# Patient Record
Sex: Female | Born: 1970 | Race: Asian | Hispanic: No | Smoking: Current every day smoker
Health system: Southern US, Community
[De-identification: ages and names within clinical notes are randomized; demographics above are authoritative.]

## PROBLEM LIST (undated history)

## (undated) DIAGNOSIS — I639 Cerebral infarction, unspecified: Secondary | ICD-10-CM

## (undated) DIAGNOSIS — N200 Calculus of kidney: Secondary | ICD-10-CM

## (undated) DIAGNOSIS — E039 Hypothyroidism, unspecified: Secondary | ICD-10-CM

## (undated) HISTORY — PX: OVARIAN CYST REMOVAL: SHX89

---

## 2000-10-22 ENCOUNTER — Encounter: Payer: Self-pay | Admitting: Neurosurgery

## 2000-10-22 ENCOUNTER — Inpatient Hospital Stay (HOSPITAL_COMMUNITY): Admission: EM | Admit: 2000-10-22 | Discharge: 2000-10-28 | Payer: Self-pay | Admitting: Neurosurgery

## 2000-10-25 ENCOUNTER — Encounter: Payer: Self-pay | Admitting: Neurosurgery

## 2000-11-21 ENCOUNTER — Encounter: Payer: Self-pay | Admitting: Neurosurgery

## 2000-11-21 ENCOUNTER — Encounter: Admission: RE | Admit: 2000-11-21 | Discharge: 2000-11-21 | Payer: Self-pay | Admitting: Neurosurgery

## 2001-01-01 ENCOUNTER — Encounter: Payer: Self-pay | Admitting: Hematology & Oncology

## 2001-01-01 ENCOUNTER — Ambulatory Visit (HOSPITAL_COMMUNITY): Admission: RE | Admit: 2001-01-01 | Discharge: 2001-01-01 | Payer: Self-pay | Admitting: Hematology & Oncology

## 2001-01-02 ENCOUNTER — Ambulatory Visit (HOSPITAL_COMMUNITY): Admission: RE | Admit: 2001-01-02 | Discharge: 2001-01-02 | Payer: Self-pay | Admitting: Hematology & Oncology

## 2001-01-02 ENCOUNTER — Encounter: Payer: Self-pay | Admitting: Hematology & Oncology

## 2001-06-20 ENCOUNTER — Encounter: Payer: Self-pay | Admitting: Neurosurgery

## 2001-06-20 ENCOUNTER — Ambulatory Visit (HOSPITAL_COMMUNITY): Admission: RE | Admit: 2001-06-20 | Discharge: 2001-06-20 | Payer: Self-pay | Admitting: Neurosurgery

## 2001-06-21 ENCOUNTER — Encounter: Payer: Self-pay | Admitting: Neurosurgery

## 2001-07-24 ENCOUNTER — Encounter: Payer: Self-pay | Admitting: Neurosurgery

## 2001-07-24 ENCOUNTER — Encounter: Admission: RE | Admit: 2001-07-24 | Discharge: 2001-07-24 | Payer: Self-pay | Admitting: Neurosurgery

## 2001-08-07 ENCOUNTER — Encounter: Payer: Self-pay | Admitting: Neurosurgery

## 2001-08-07 ENCOUNTER — Encounter: Admission: RE | Admit: 2001-08-07 | Discharge: 2001-08-07 | Payer: Self-pay | Admitting: Neurosurgery

## 2001-08-23 ENCOUNTER — Encounter: Admission: RE | Admit: 2001-08-23 | Discharge: 2001-08-23 | Payer: Self-pay | Admitting: Neurosurgery

## 2001-08-23 ENCOUNTER — Encounter: Payer: Self-pay | Admitting: Neurosurgery

## 2004-06-29 ENCOUNTER — Ambulatory Visit: Payer: Self-pay | Admitting: Unknown Physician Specialty

## 2004-07-29 ENCOUNTER — Inpatient Hospital Stay: Payer: Self-pay | Admitting: Unknown Physician Specialty

## 2004-09-17 ENCOUNTER — Ambulatory Visit: Payer: Self-pay | Admitting: Urology

## 2004-10-06 ENCOUNTER — Ambulatory Visit: Payer: Self-pay | Admitting: Urology

## 2004-10-21 ENCOUNTER — Emergency Department: Payer: Self-pay | Admitting: Emergency Medicine

## 2004-10-22 ENCOUNTER — Ambulatory Visit: Payer: Self-pay | Admitting: Urology

## 2006-09-12 HISTORY — PX: ABDOMINAL HYSTERECTOMY: SHX81

## 2006-10-15 ENCOUNTER — Inpatient Hospital Stay: Payer: Self-pay | Admitting: Internal Medicine

## 2006-10-15 ENCOUNTER — Other Ambulatory Visit: Payer: Self-pay

## 2006-10-18 ENCOUNTER — Ambulatory Visit: Payer: Self-pay | Admitting: Internal Medicine

## 2006-12-06 ENCOUNTER — Ambulatory Visit: Payer: Self-pay | Admitting: Unknown Physician Specialty

## 2006-12-14 ENCOUNTER — Inpatient Hospital Stay: Payer: Self-pay | Admitting: Unknown Physician Specialty

## 2006-12-18 ENCOUNTER — Other Ambulatory Visit: Payer: Self-pay

## 2006-12-18 ENCOUNTER — Inpatient Hospital Stay: Payer: Self-pay | Admitting: *Deleted

## 2007-03-27 ENCOUNTER — Ambulatory Visit: Payer: Self-pay | Admitting: Psychiatry

## 2007-03-27 ENCOUNTER — Inpatient Hospital Stay (HOSPITAL_COMMUNITY): Admission: AD | Admit: 2007-03-27 | Discharge: 2007-03-29 | Payer: Self-pay | Admitting: Psychiatry

## 2008-01-31 IMAGING — US US RENAL KIDNEY
1 series · 17 of 25 positions shown · non-contrast
Comparison: none

REASON FOR EXAM: hematuria, renal insufficiency, back pain
COMMENTS:

[Series 1: us renal kidney · 17 of 29 slices shown]
[im 1/29]
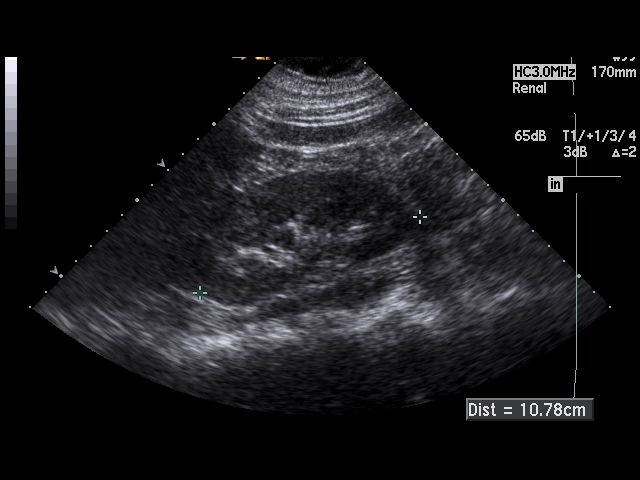
[im 3/29]
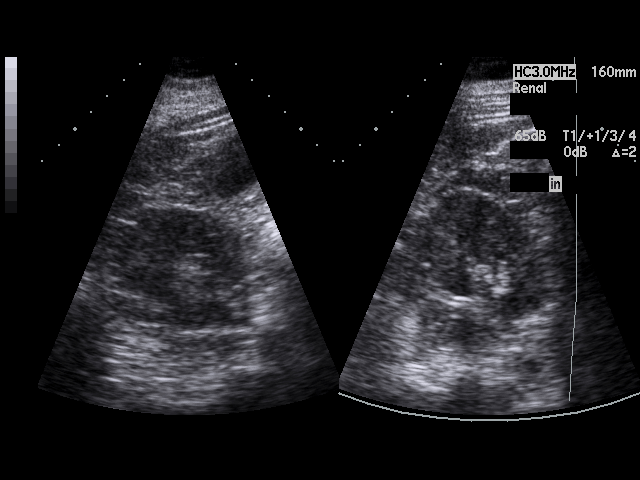
[im 4/29]
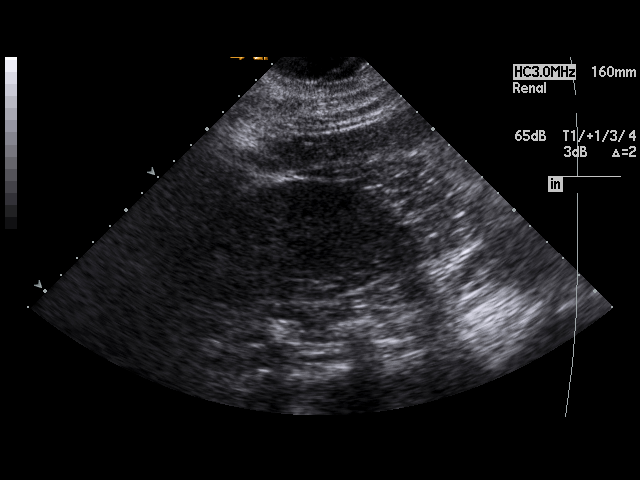
[im 6/29]
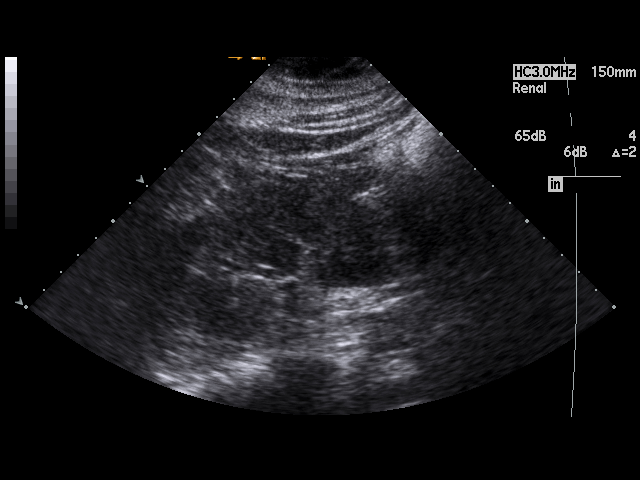
[im 8/29]
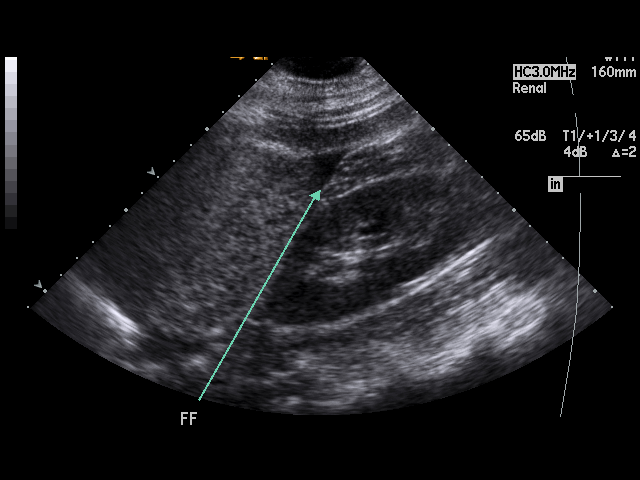
[im 10/29]
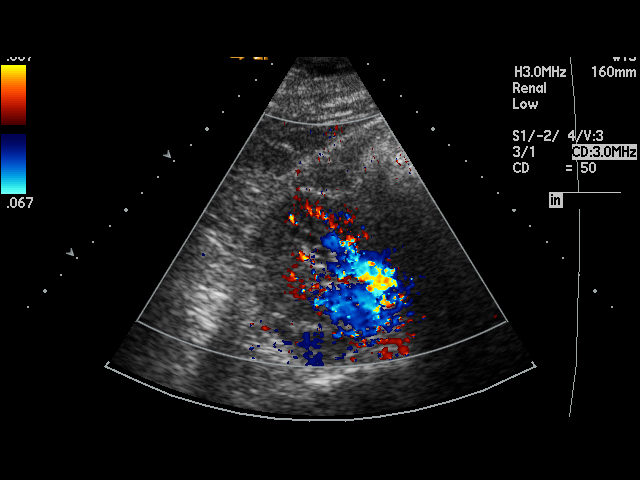
[im 11/29]
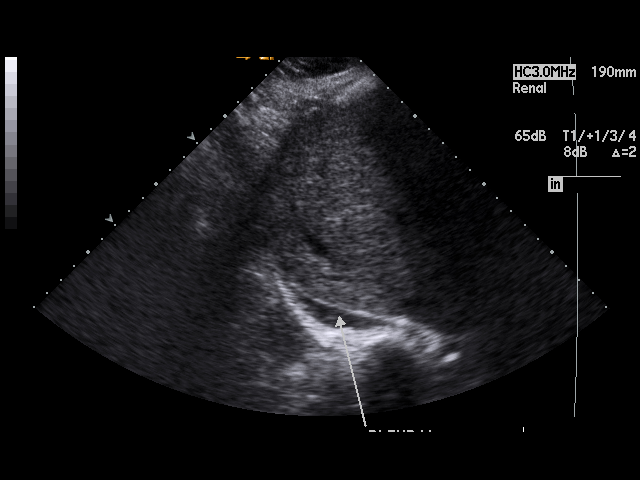
[im 13/29]
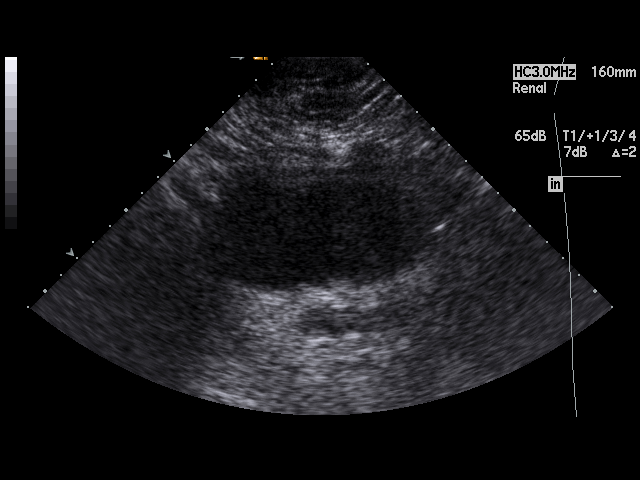
[im 15/29]
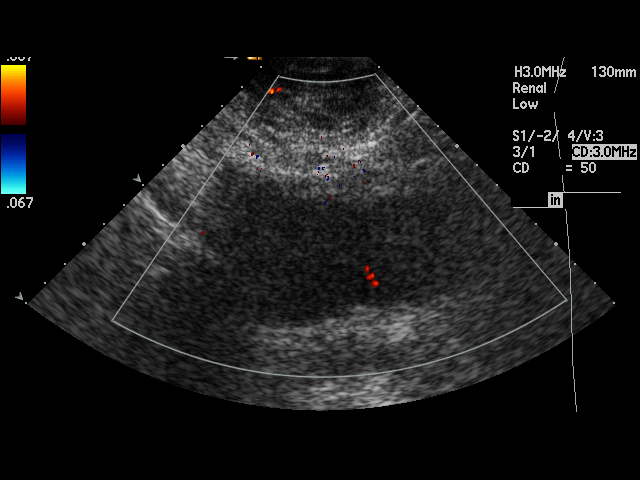
[im 16/29]
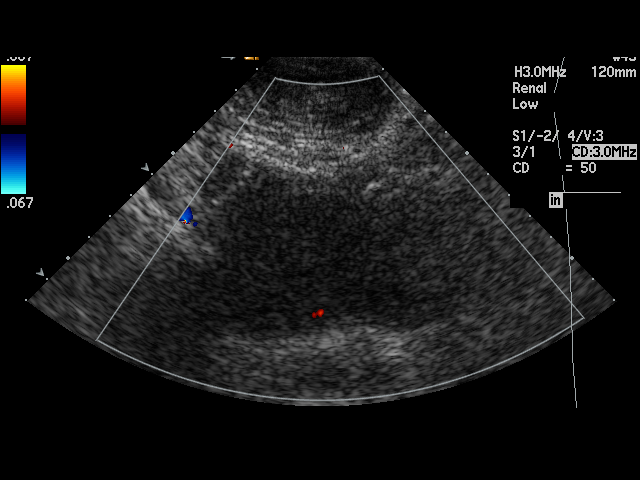
[im 18/29]
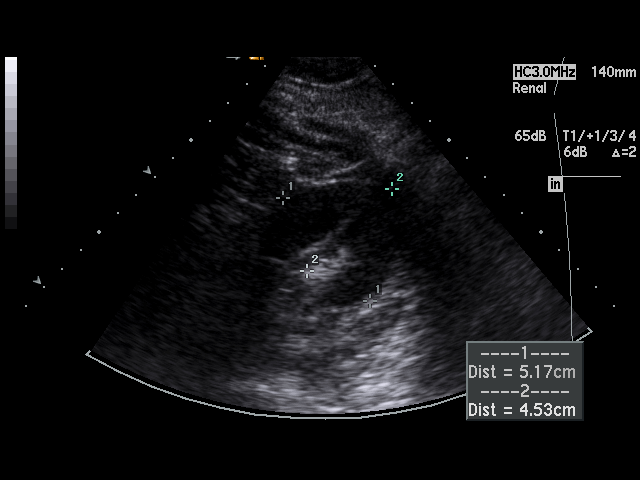
[im 19/29]
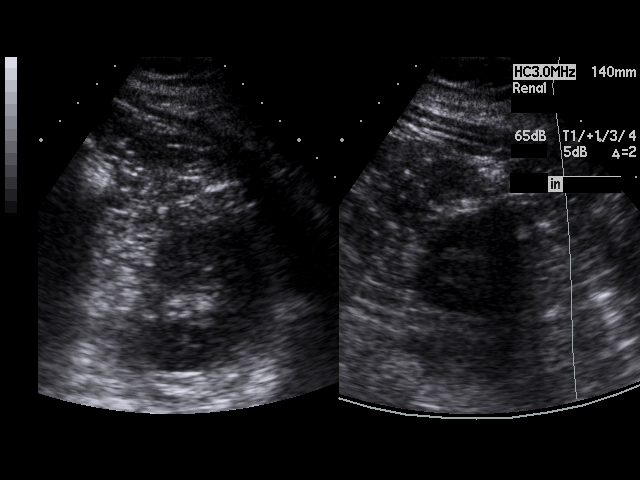
[im 22/29]
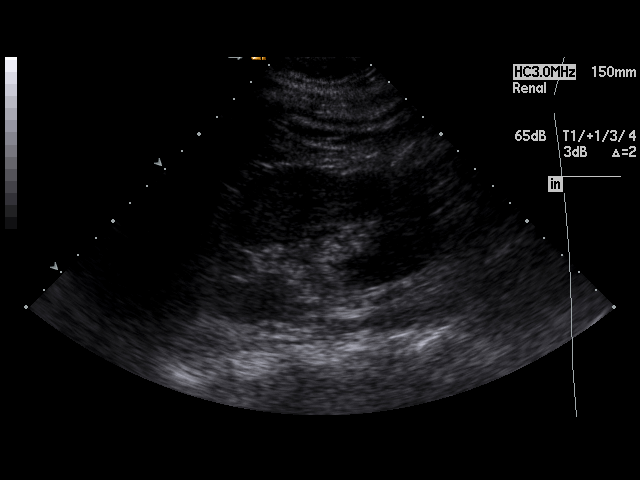
[im 23/29]
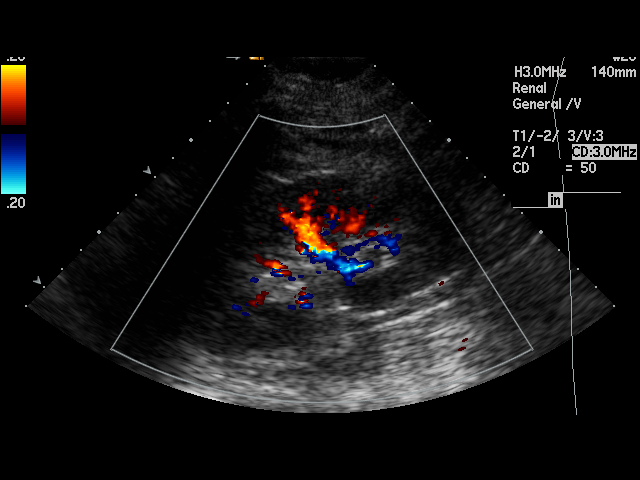
[im 25/29]
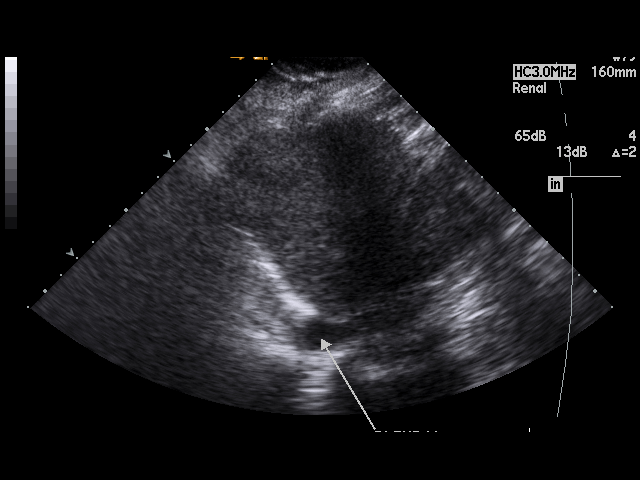
[im 26/29]
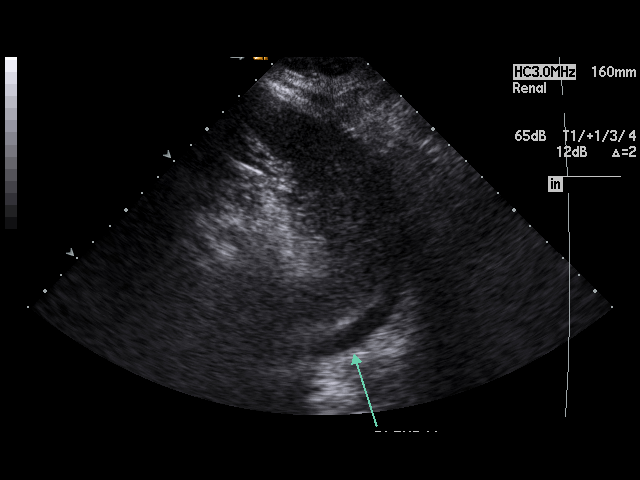
[im 29/29]
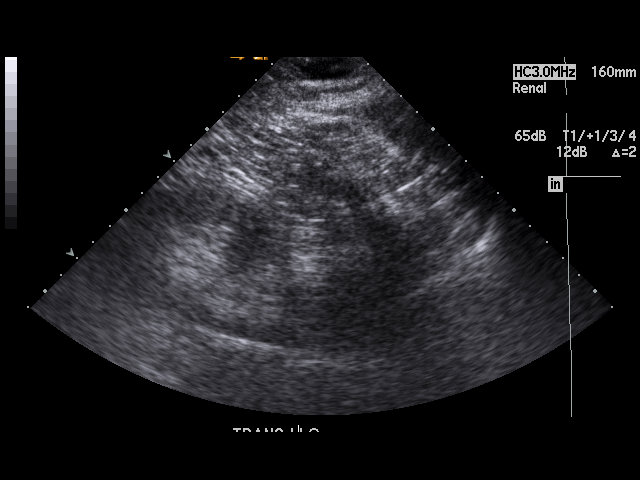

[17 of 25 positions shown; findings below may reference images not displayed]

PROCEDURE:     US  - US KIDNEY BILATERAL  - December 20, 2006  [DATE]

RESULT:     The RIGHT kidney measures 10.78 cm x 4.78 cm x 4.94 cm and the
LEFT kidney measured 11.69 cm x 5.17 cm x 4.53 cm. No solid or cystic renal
mass lesions are seen. The renal cortical margins are smooth. The renal
cortex is normal in thickness. The visualized portion of the urinary bladder
shows no significant abnormalities.

There is a trace of fluid anterior to the RIGHT kidney. Also noted is a
RIGHT pleural effusion.
IMPRESSION: 1. No hydronephrosis or renal mass lesions are identified.
2. No renal calcifications are seen.
3. The kidneys are within normal limits for size.
4. There is trace of fluid anterior to the RIGHT kidney.
5. There is a small RIGHT pleural effusion.

## 2008-02-01 IMAGING — CT CT CHEST-ABD W/ CM
1 of 2 series · 13 of 32 positions shown, 18 images · non-contrast
Comparison: none

REASON FOR EXAM: (1) followup pulmonary infiltrates; (2) follow-up
abnormal renal CT
COMMENTS:

[Series 2: soft tissue · axial · 0.79mm/px · z∈[-40,+346]mm · 13 of 89 slices shown, 18 images]
[im 6/89  mediastinal]
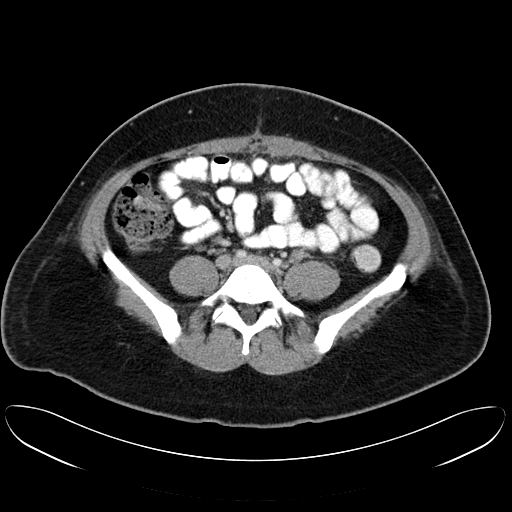
[im 6/89  bone]
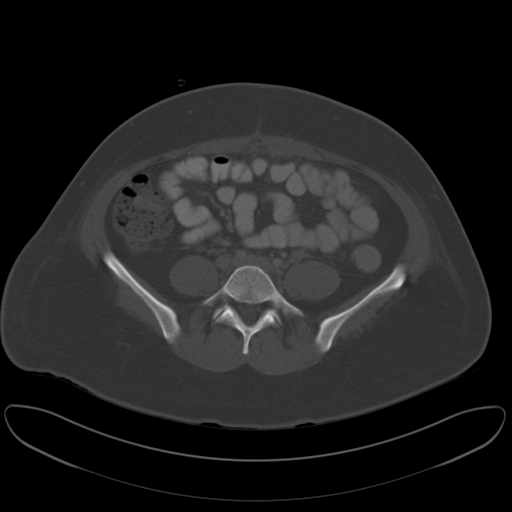
[im 17/89  mediastinal]
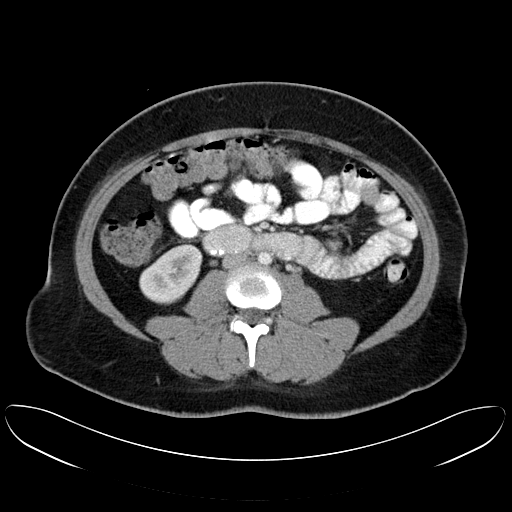
[im 23/89  mediastinal]
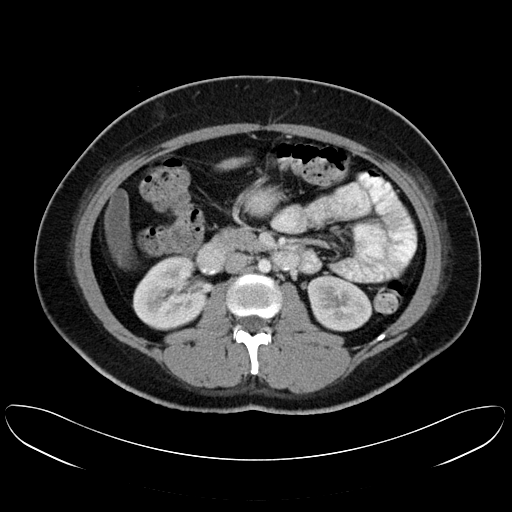
[im 30/89  mediastinal]
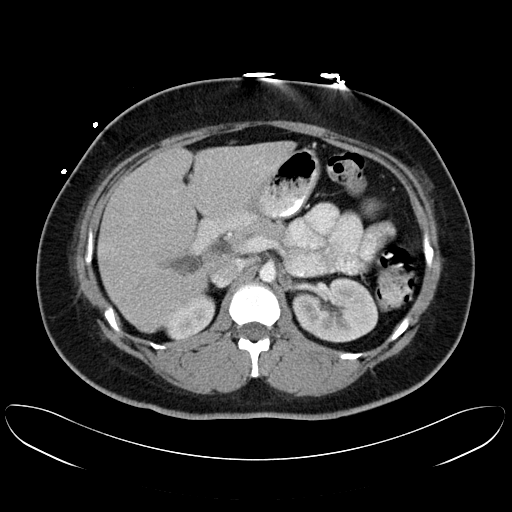
[im 34/89  mediastinal]
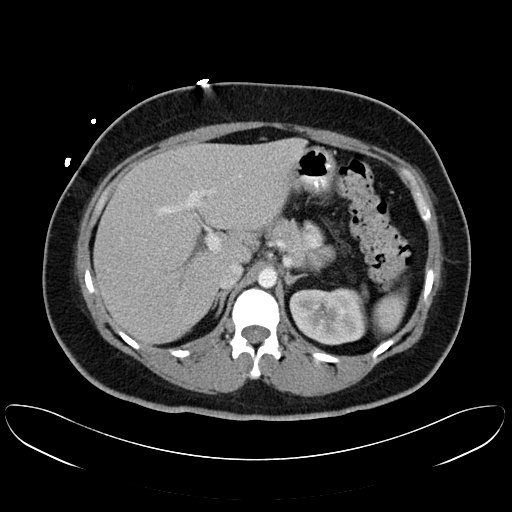
[im 43/89  mediastinal]
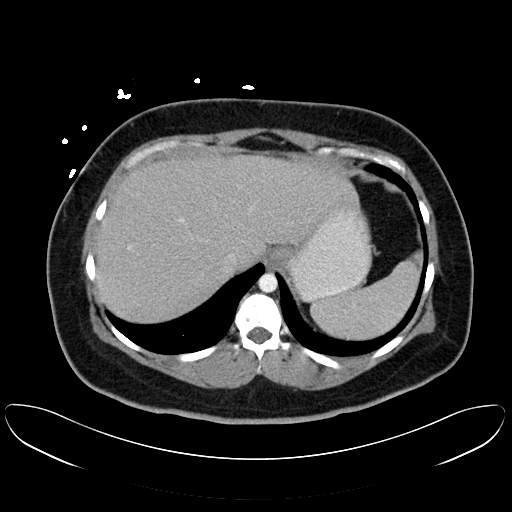
[im 45/89  mediastinal]
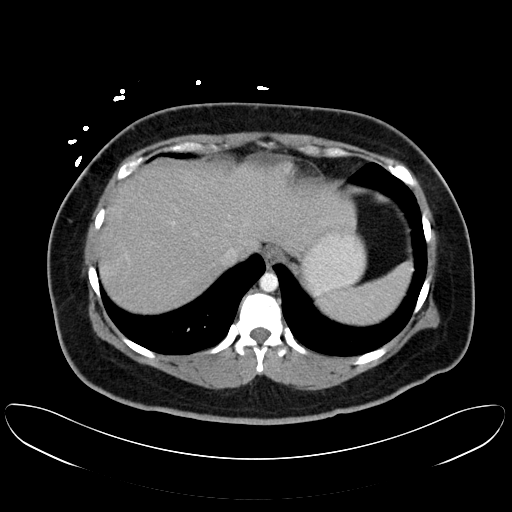
[im 56/89  mediastinal]
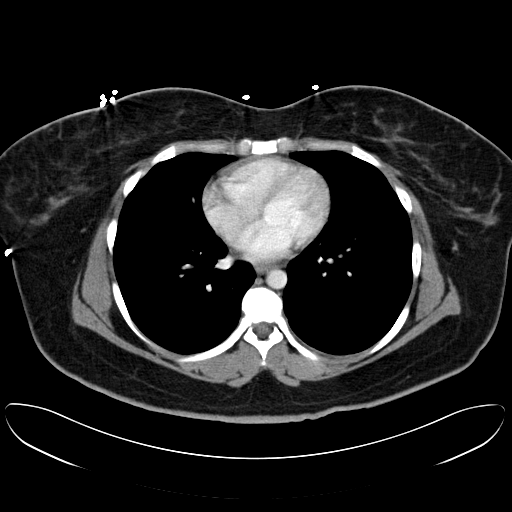
[im 59/89  mediastinal]
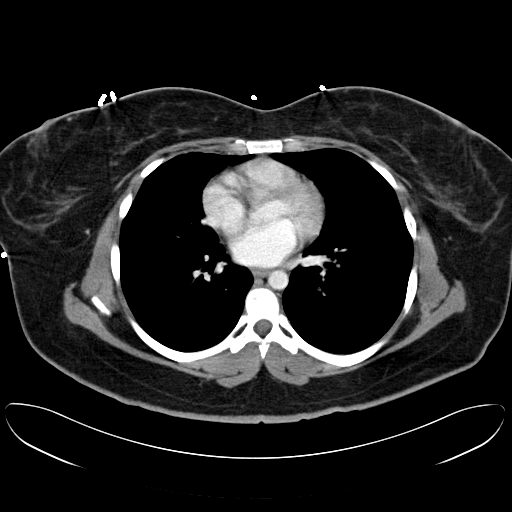
[im 59/89  bone]
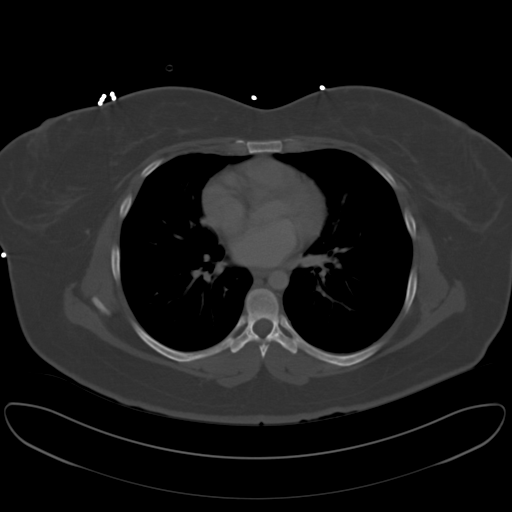
[im 67/89  mediastinal]
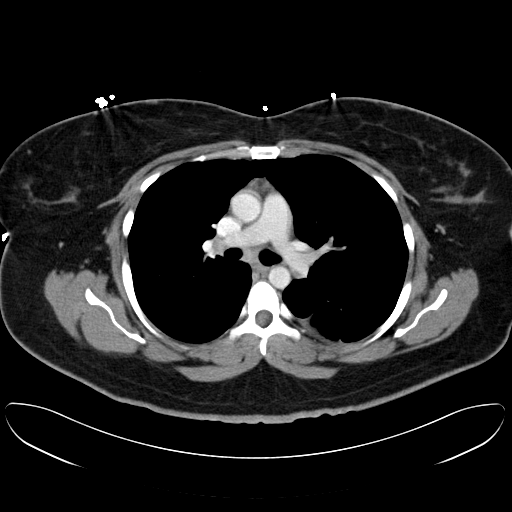
[im 67/89  lung]
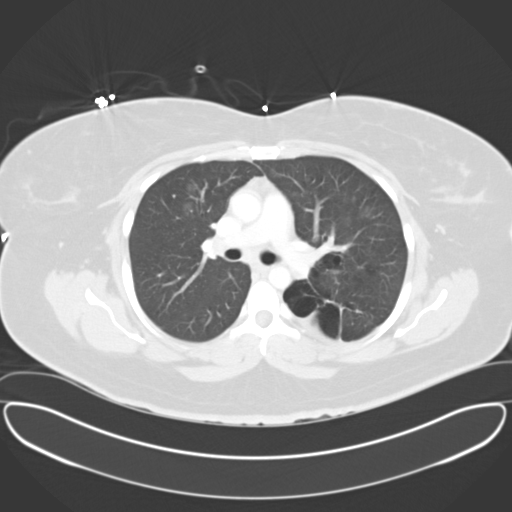
[im 72/89  mediastinal]
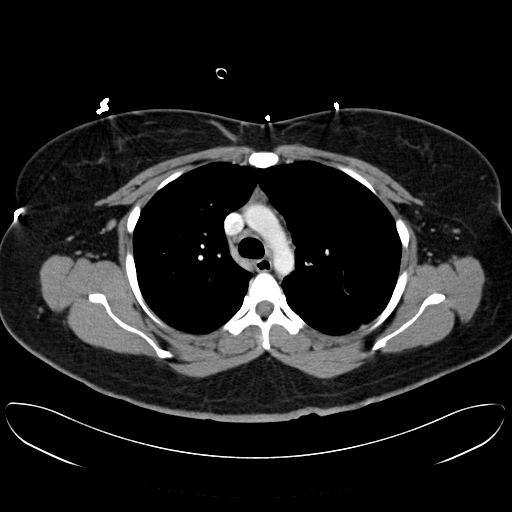
[im 72/89  lung]
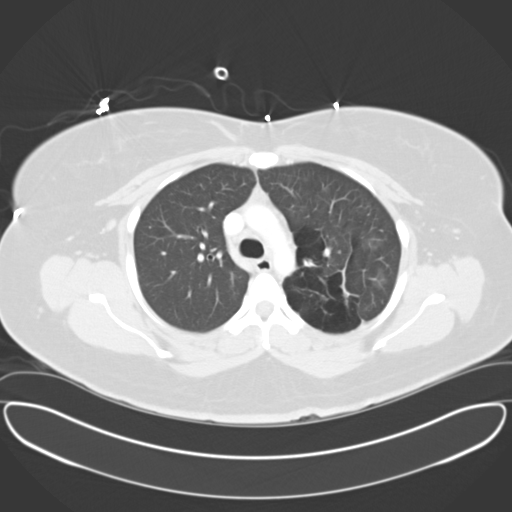
[im 78/89  lung]
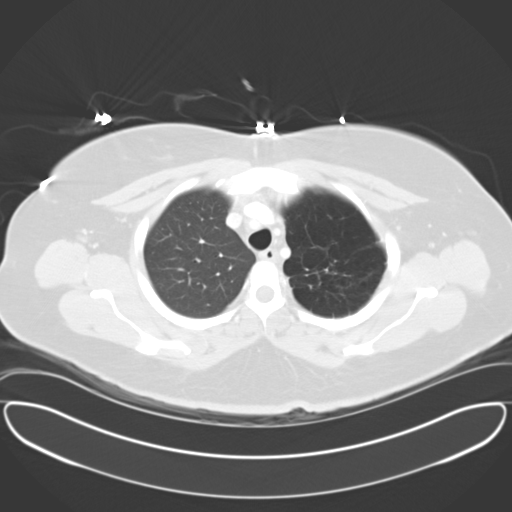
[im 83/89  mediastinal]
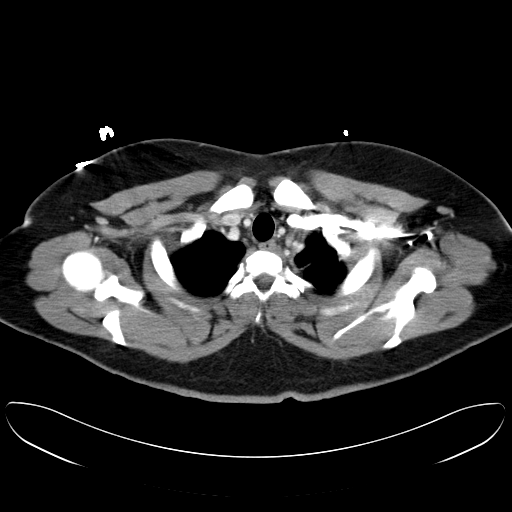
[im 83/89  lung]
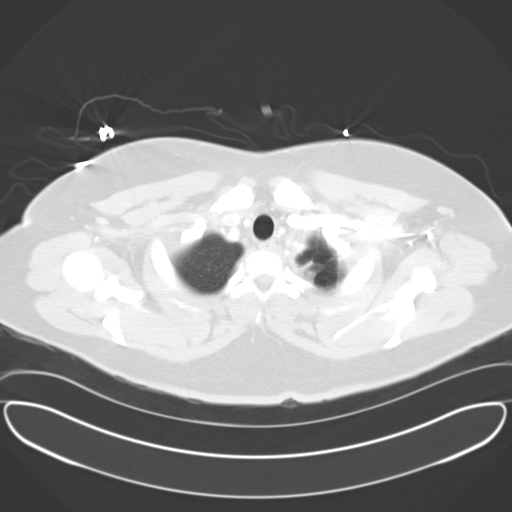

[13 of 32 positions shown; findings below may reference images not displayed]

PROCEDURE:     CT  - CT CHEST AND ABDOMEN W  - December 21, 2006 [DATE]

RESULT:     Comparison is made to a CT scan of the chest dated 18 December, 2006.

CT SCAN OF THE CHEST: The cardiac chambers are normal in size. There is no
pleural or pericardial effusion. The asymmetry of the density of the LEFT
upper lobe with respect to the remainder of both lungs persists. This likely
reflects lobar emphysema. The increased interstitial density seen elsewhere
in the lungs on the prior study has markedly improved such that the
appearance of both lungs is normal outside of the emphysematous LEFT upper
lobe. I see no pulmonary parenchymal masses. Within the mediastinum the
fullness in the subcarinal region has become less conspicuous and is now
normal. The LEFT pleural effusion is noted to have cleared. The caliber of
the thoracic aorta is normal.
CONCLUSION: 1)There has been marked improvement in the appearance of both lungs with
resolution of the interstitial edema. The LEFT upper lobe remains
emphysematous.
2. There has been resolution of the pleural effusions bilaterally.
3. Fullness in the subcarinal region is no longer evident.

CT SCAN OF THE ABDOMEN: The patient received the aforementioned IV contrast
as well as oral contrast material.

The bowel gas pattern is within the limits of normal. There is no evidence
of ileus or obstruction. The liver, gallbladder, spleen, stomach, pancreas,
adrenal glands, and kidneys exhibit no acute abnormality. The caliber of the
abdominal aorta is normal. There is no periaortic or pericaval
lymphadenopathy. There is no evidence of ascites. Specific attention to both
kidneys reveals no evidence of obstruction, parenchymal masses, surrounding
inflammatory change, or calcified lymph nodes. The asymmetric enhancement
seen on the prior study is not evident today.
IMPRESSION: 1. There has been marked improvement in the appearance of the thorax. Please
see the conclusions above.
2. The kidneys are symmetric in enhancement pattern with no evidence of
obstruction or masses or calcification.
3. I see no acute abnormality elsewhere within the abdomen.

## 2008-02-03 IMAGING — CR DG CHEST 1V PORT
1 series · 1 of 1 positions shown · non-contrast
Comparison: none

REASON FOR EXAM: SOB
COMMENTS:   LMP: Post Hysterectomy

[view not recorded]
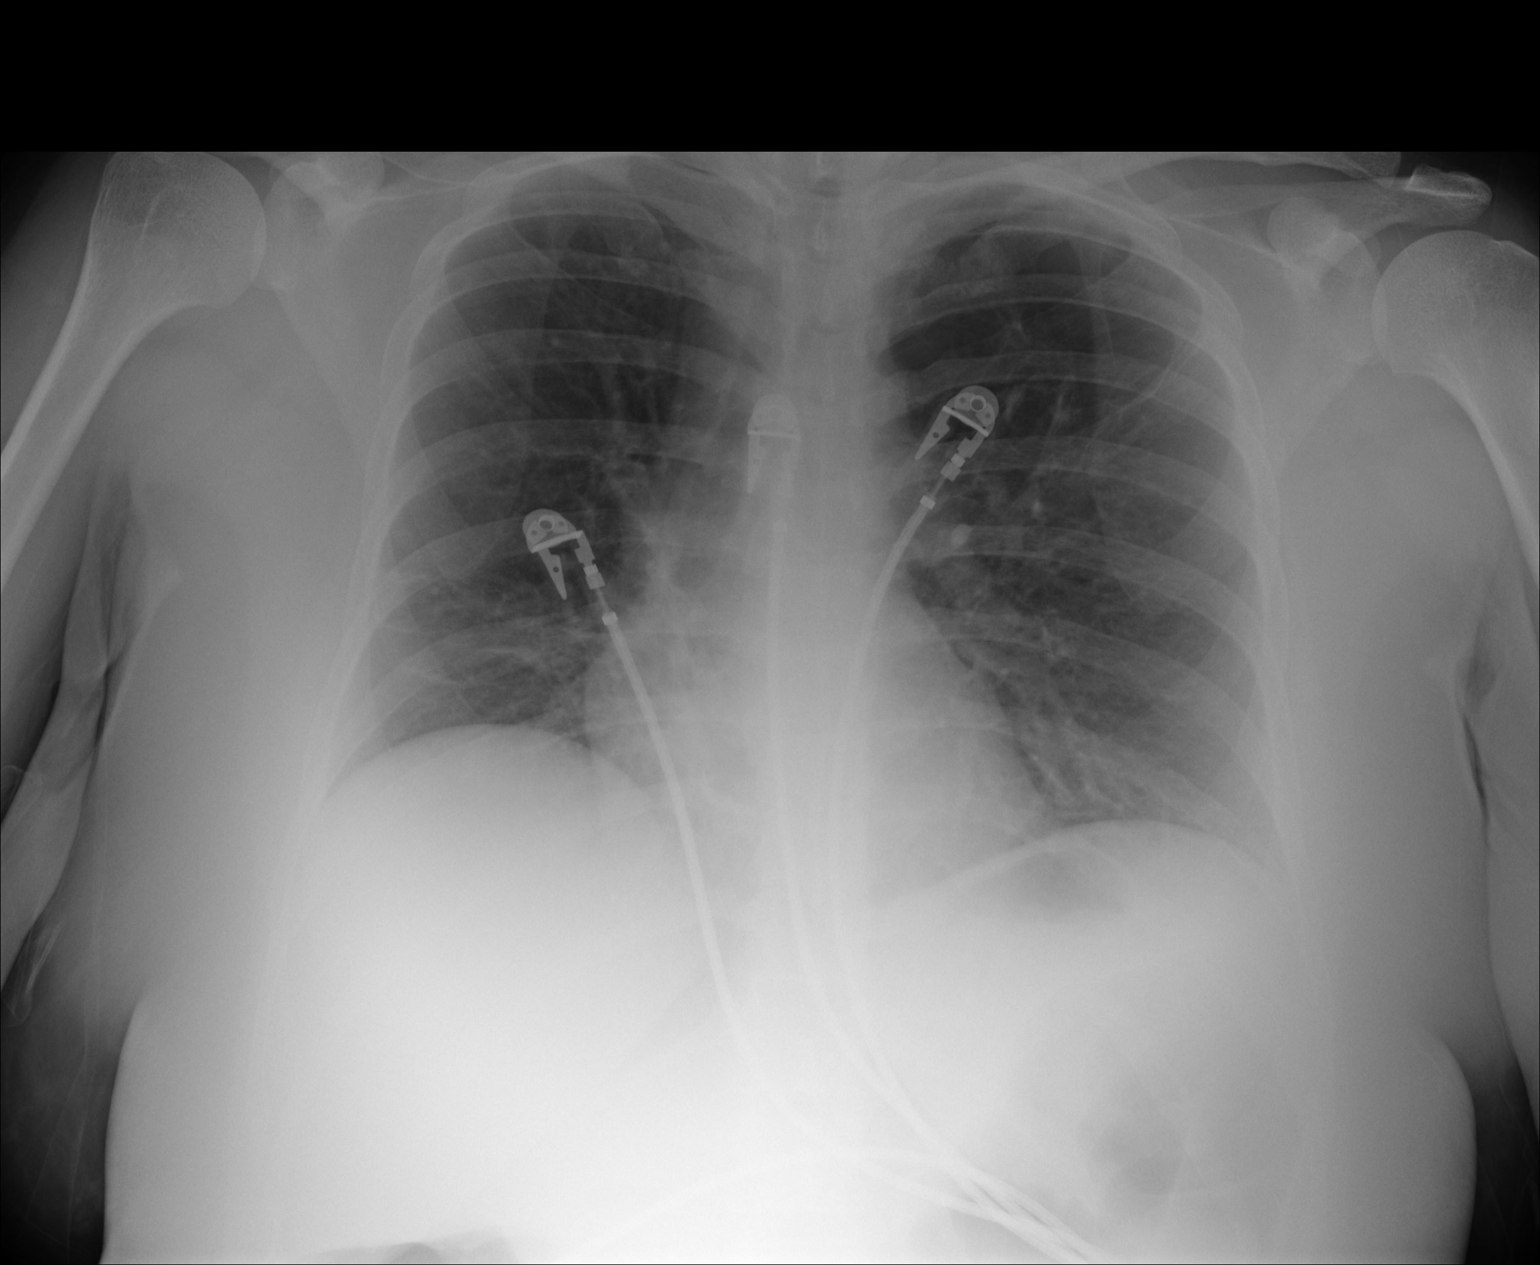

[1 of 1 positions shown; findings below may reference images not displayed]

PROCEDURE:     DXR - DXR PORTABLE CHEST SINGLE VIEW  - December 23, 2006  [DATE]

RESULT:     Comparison is made to study 18 December, 2006.

The lungs are slightly less well-inflated today. The interstitial markings
do not appear to have become more conspicuous. In fact, there has been
slight improvement in the appearance of the pulmonary interstitium since [DATE]. The pulmonary vascularity is mildly prominent though stable.
IMPRESSION:

## 2009-01-09 ENCOUNTER — Ambulatory Visit: Payer: Self-pay | Admitting: Specialist

## 2011-01-28 NOTE — H&P (Signed)
[place]. [facility]  Patient:    Evelyn Cunningham, Evelyn Cunningham                      MRN: [id number] Adm. Date:  [id number] Attending:  Emeterio Reeve                         History and Physical  ADMITTING DIAGNOSIS: Brain tumor.  HISTORY OF PRESENT ILLNESS: This patient is a 40 year old right-handed white female of [place] origin.  I was called from [facility] regarding her. She has had about a week long left occipital headache that is fairly severe, although this has been going on at a low level for about a month.  This is associated with some nausea and vomiting, pain in the left posterior pharynx, and inside her ear.  She has no hearing deficit.  PAST MEDICAL HISTORY: Benign.  PAST SURGICAL HISTORY: Colonoscopy.  ALLERGIES: No known drug allergies.  CURRENT MEDICATIONS:  1. Oral contraceptives.  2. Imitrex for two days, which she said helped once.  3. Vicodin p.r.n.  SOCIAL HISTORY: She does not smoke and does not drink.  REVIEW OF SYSTEMS: Negative.  PHYSICAL EXAMINATION:  GENERAL: She is awake and alert.  HEENT: Examination within normal limits.  NECK: She has good range of motion of her neck without lymphadenopathy noted.  CHEST: Clear.  CARDIAC: Regular rate and rhythm.  ABDOMEN: Nontender.  No hepatosplenomegaly.  EXTREMITIES: Without clubbing, cyanosis, or edema.  Peripheral pulses good.  GU: Examination deferred.  NEUROLOGIC: She is awake and alert and oriented.  PERRL.  EOMI.  Facial movement and sensation intact.  Tongue midline.  Speech normal.  She describes no swallowing difficulties.  Motor examination shows intact strength with no pronator drift.  She describes no sensory deficit.  Hoffmanns negative.  No clonus.  Toes downgoing bilaterally.  LABORATORY DATA: CT scan demonstrates an approximate 1-2 cm lesion in the left anterior occiput lying along the tentorium on the left side.  There is no contrast given but there  is minimal, if any, shift and no edema associated with it.  CLINICAL IMPRESSION: Brain lesion.  Differential would include hemangioma, brain tumor, and abscess.  Lacking edema or shift abscess and tumor seem unlikely.  I favor hemangioma with perhaps a small amount of bleeding associated with it.  Obvious other concern might be an AV fistula, although this would be a bit far-fetched with these findings.  PLAN: I think the most reasonable to do is admit her for pain control and obtain an MRI. DD:  10/23/99 TD:  10/22/00 Job: [id number]

## 2011-01-28 NOTE — Consult Note (Signed)
[place]. [facility]  Patient:    Evelyn, Cunningham                      MRN: [id number] Proc. Date: 10/26/00 Adm. Date:  [id number] Attending:  Emeterio Reeve Dictator:   Rose Phi. Myna Hidalgo, M.D. CC:         Evelyn Cunningham, M.D.  Evelyn Cunningham, M.D.  Evelyn Cunningham. Evelyn Cunningham, M.D.   Consultation Report  REASON FOR CONSULTATION: 1. Superior sagittal side thrombosis. 2. Hypercoagulable state.  HISTORY OF PRESENT ILLNESS:  Ms. Evelyn Cunningham is a 40 year old [place] female, with no significant past medical history outside of having a miscarriage a couple of years ago.  She notes that she had a headache for about a week prior to being admitted to [facility].  She stated the headache was in the left occipital region.  This was fairly severe.  She noted pain radiating to the left posterior pharynx and left ear.  She had no other symptoms.  This was not particularly typical of a migraine, which she occasionally has.  She underwent a CT scan which showed a 1.5-2 cm lesion in the left anterior occiput, lying along the tentorium on the left side.  Unfortunately, contrast was not given.  She subsequently underwent an MRI.  The brain showed left transverse and sigmoid side thrombosis, along with clot in the left internal jugular vein.  There is a small brain hemorrhage along the undersurface of the left temporal lobe.  The left occipital lobe is slightly deformed, maybe from an old infarct.  There is no evidence of any other abscess or tumor.  She was admitted.  She went to the intensive care unit.  She was seen in consultation by Dr. Zola Button T. Cunningham.  She had ID consultation for the possibility of suppurative thrombophlebitis.  All cultures were negative. Again, she was seen by ID.  She was placed on some antibiotics empirically. She had an elevated white cell count with left shift.  She improved over the next several days.  She had an  excessive array of blood studies done for an underlying thrombophilia.  She had factor V widened and a prothrombinogen mutation sent off.  She had a lupus anticoagulant, and a phospholipid antibody, and ______ antibody sent off.  She had antithrombin III, protein S and protein C.  She also had factor VIII level sent off.  A homocysteine level included.  Unfortunately, these studies are not returned as yet.  PAST MEDICAL HISTORY:  Relatively unremarkable, aside of the miscarriage a couple of years ago.  ALLERGIES:  She has, I think, no allergy.  CURRENT MEDICATIONS: 1. Celebrex 100 mg q.12h. 2. Nizoral cream q.d. 3. Vicodin as needed.  SOCIAL HISTORY:  There is no history of tobacco use.  There is no alcohol use.  She works as a Occupational psychologist for [employer].  FAMILY HISTORY:  Only contributory for diabetes with her mother.  She has a sister having four miscarriages finally having a full-term pregnancy.  REVIEW OF SYSTEMS:  As I stated in the history of present illness.  PHYSICAL EXAMINATION:  GENERAL:  This is a slightly obese [place] female, in no obvious distress. She is alert and oriented x 3.  VITAL SIGNS:  Temperature 98.4, pulse 86, respiratory rate 20, blood pressure 128/70.  HEENT:  Normocephalic, atraumatic skull.  She has no ocular or oral lesions. She shows good extraocular muscle movements.  Pupils react appropriately.  She has no oral petechia.  There is no oral permutation.  NECK:  Good range of motion.  There is no meningismus.  There is a palpable cervical supraclavicular adenopathy.  Thyroid is not palpable.  LUNGS:  Clear to percussion and auscultation bilaterally.  CARDIAC:  Shows a regular rate and rhythm, with normal S1, S2.  There are murmurs, rubs or bruits.  ABDOMEN:  Slightly obese and soft.  She has good bowel sounds.  There is no palpable abdominal mass.  There is no palpable hepatosplenomegaly.  BACK:  Shows no tenderness over  the spine, ribs or hips.  EXTREMITIES:  Show no clubbing, cyanosis, or edema.  She had a good range of motion of her joints.  SKIN:  Does reveal rash consistent with tinea.  There was no maculopapular type rashes.  No malar rashes noted.  NEUROLOGIC:  Shows no focal neurological deficits.  LABORATORY STUDIES:  White cell count 10.4, hemoglobin 12.6, hematocrit 37.6, platelet count 315.  MCV 82.  White cell differential is within normal limits. PT 14, PTT 27.  Antithrombin III level 95.  She had albumin of only 2.5. Total protein 7.1.  LFTs are within normal limits.  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  This extends out to left internal jugular vein.  There clearly is an underlying hypercoagulable state with Evelyn Cunningham.  The fact that she is on oral contraceptives increases the risks even more of having a thrombotic episode.  The fact that she had it in the cerebral venous system is quite unusual, but definitely reported in the literature.  I suspect that we will likely find either a factor V widener and distal mutation; or anti phospholipid antibody or ______ syndrome.  The fact that she has had a miscarriage, alongside with that of a sister that has had four miscarriages, certainly suggests an ______ antibody somewhere along the way. Also on account on the fact that her parents are related, also would predispose to some type of hereditary thrombophilia.  The studies that have been sent off clearly will take several days before they return.  The role of anticoagulation with these cerebral vein thromboses is very controversial.  You can find arguments pro and con regarding anti correlation. Given the fact that she is stable, has no neurological deficits, and as far as  we know she has removed the stimulus for hypercoagulability (i.e. oral contraceptives), I think that we could  just observe her for right now.  If we do find that she does indeed have one or more hypercoagulable conditions, then we may need to anticoagulate her.  She clearly is to avoid oral contraceptives or estrogen replacement therapy. If she were to get pregnant, she would have to be placed on prophylactic anticoagulation with Lovenox or one of the other low molecular weight heparins.  I believe that it is okay for Ms. Colarusso to go home in the morning, as she has been stable.  She still has a little bit of a headache, but this is controlled with the Celebrex and Vicodin.  I will follow up with Ms. Kochanowski as an outpatient once I receive all of her results of her hypercoagulable workup.  I greatly appreciate the opportunity to see Ms. Scaletta.  She is a very nice lady who presents with a most interesting problem. DD:  10/26/00 TD:  10/26/00 Job: [id number]

## 2011-01-28 NOTE — Consult Note (Signed)
[place]. [facility]  Patient:    Evelyn Cunningham, Evelyn Cunningham                      MRN: [id number] Proc. Date: 10/22/00 Adm. Date:  [id number] Attending:  Emeterio Reeve CC:         Payton Doughty, M.D.   Consultation Report  CHIEF COMPLAINT:  Left headache and referred otalgia.  HISTORY OF PRESENT ILLNESS:  A 40 year old second generation [place] immigrant female has a one-week history of pain on the left side of her head, especially temporal and postauricular.  It has been getting worse.  Last evening she presented to [facility] Room where a CT scan showed a slight abnormality of the brain on that side, and she was transferred to [facility].  Here, an MRI scan suggested fluid in the mastoid as well as thrombosis of the left sigmoid sinus and superior jugular vein with small amounts of venous infarct in the brain.  Slight meningeal thickening.  There was noted to be fluid in the mastoid but no fluid in the middle ear.  No evidence of external swelling or fluid collection.  The right ear is fine.  The patient has no history of recent upper respiratory infection, sinus infection, dental problems, or ear infection.  No truisms.  No pain with motion of her neck.  No difficulty breathing or swallowing.  No insect, animal bites or scratches.  She does have a skin condition where she has developed hyperpigmented spots and is scheduled to see a dermatologist in the next few months.  She has never had clotting disorders in the past.  She does take birth control pills but does not smoke.  On admission to the hospital, she is afebrile but does have a white blood cell count of 18,000 with a substantial left shift.  PHYSICAL EXAMINATION:  GENERAL:  This is an animated, healthy-appearing adult [place] female. Metal status is acute and appropriate.  She moves freely without discomfort. She hears well in conversational speech.  Voice is clear and  respirations unlabored.  HEAD AND NECK:  Head is atraumatic and neck supple.  Both ear canals are clear with aerated drums of normal configuration.  She is very slightly tender in the postauricular area on the left side with no erythema and no edema.  The right side is clear.  She has no trismus or TMJ tenderness.  No neck tenderness or adenopathy.  Cranial nerves intact.  Anterior nose slightly excoriated and dry.  Oral cavity is clear with teeth in good repair.  Normal moist membranes.  Oropharynx clear with small tonsil and normal soft palate. Did not examine nasopharynx or hypopharynx.  Neck without adenopathy.  IMPRESSION:  Left sigmoid/jugular thrombosis ? septic.  Mastoid fluid but no clinical otitis media or mastoiditis.  White blood cell count of 18,000 with left shift is worrisome for infection, but thus far no fevers.  I wonder if she could have Lemierres syndrome which is septic thrombophlebitis of the jugular vein.  She has an unusual hyperpigmenting skin rash, and I wonder if there could be some underlying hematologic condition causing several different problems.  PLAN:  Will check blood cultures. I think an infectious disease consultation is appropriate.  I discussed all of this with Dr. Channing Mutters. DD:  10/22/00 TD:  10/23/00 Job: [id number]

## 2011-01-28 NOTE — Discharge Summary (Signed)
[place]. [facility]  Patient:    Evelyn Cunningham, Evelyn Cunningham                      MRN: [id number] Adm. Date:  [id number] Disc. Date: [id number] Attending:  Emeterio Reeve                           Discharge Summary  FINAL DIAGNOSES: 1. Phlebitis of intracranial sinus. 3. Pityriasis versicolor. 3. Blood disease not otherwise classified.  HISTORY OF PRESENT ILLNESS AND HOSPITAL COURSE:  Evelyn Cunningham is a 40 year old woman of [place] origin who developed a left occipital headache which was fairly severe that had been going on for about one month associated with some nausea and vomiting and pain in the left posterior pharynx and inside the ear.  She had no hearing deficit.  She has a history of oral contraceptive use.  She does not smoke or drink.  The patient had essentially a normal neurologic examination.  She had a head CT which showed a 1-2 cm lesion in the left anterior occipital lobe lying along the tentorium on the left side.  No contrast given but there was minimal if any shift and no edema associated with that.  It was felt that this represented a brain lesion which might represent a tumor possibly hemangioma abscess.  It was felt that it could have represented AV fistula.  The patient underwent an MRI of her brain and this was consistent with left sigmoid sinus and internal jugular vein thrombosis.  She has a mastoid sinus fluid level.  She had some focal meningeal enhancement with venous thrombosis.  She was seen by ear, nose, and throat and it was felt that she did not have significant mastoiditis.  She was evaluated by hematologist and had skin scraping of abdominal areas which was felt to represent tinea versicolor and she was started on ketoconazole cream for that.  The patient was not felt to have a hypercoagulable state.  She was hydrated.  She was taken off of oral contraceptive pills.  She was doing well on October 28, 2000 and was discharged  home.  At that point instructions were to take Neurontin and hydrocodone for pain and to follow up with Dr. Channing Mutters in two weeks with a repeat CT scan of the brain. DD:  11/17/00 TD:  11/18/00 Job: [id number]

## 2011-01-28 NOTE — Discharge Summary (Signed)
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  She had been taking Unisom, Sominex, Benadryl,  Tylenol PM, all in large quantities.  She had told her medical doctor  recently that she could not sleep, which was followed by the doctor  prescribing her temazepam, which appeared ineffective despite her taking  as many as 5 at a time.  On March 26, 2007, she took 14 at once.  She had  also been on Paxil for three years which she had described as very  helpful.  She was admitted because her large ingestion on March 26, 2007  was viewed as a suicide attempt.  The patient disagreed with this,  stating that she only wanted to sleep.  Please refer to the admission  note for further details pertaining to the symptoms, circumstances and  history that led to her hospitalization.   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   MEDICAL/LABORATORY:  The patient was medically and physically assessed  by the psychiatric nurse practitioner.  She came to [place] with a history of  non-insulin-dependent diabetes mellitus and had been taking Glucophage  500 mg daily, which was continued.  There were no acute medical issues.   HOSPITAL COURSE:  The patient was admitted to the adult inpatient  psychiatric service.  She presented as a well-nourished, well-developed  woman who was alert and fully oriented, intelligent, well-organized  and  pleasant.  She was a good historian.  There were no signs or symptoms of  psychosis or thought disorder.  Her mood appeared neutral with  appropriate affect.  She denied suicidal ideation as described above.   She participated in various therapeutic groups and activities, and  quickly made good connections with staff and peers.  She remained sad,  but was generally pleasant, relaxed, and open, and absent suicidal  ideation.  She did a good job of looking inward at her current  difficulties, and showed good insight.  I'm realizing that I have a  dependency issue on sleeping pills.  She was given trazodone 50 mg to  aide with sleep, which was somewhat helpful.   On the third hospital day, the patient stated I feel ready to go  because I've realized the things I needed to and the purpose of being  here, I'm ready to make some of the changes we talked about in group  therapy.   We explored the possibility of a family session, but there was no family  nearby or any other individuals in her life  that she identifies  appropriate for a family meeting.  She was agreeable to the following  aftercare plan.   AFTERCARE:  The patient was to follow up with Evelyn Cunningham on April 03, 2007, and with Evelyn Cunningham on April 04, 2007.   DISCHARGE MEDICATIONS:  1. Glucophage 500 mg daily.  2. Paxil 40 mg daily.  3. Trazodone 100 mg q.h.s.   DISCHARGE DIAGNOSES:  AXIS I:  Major depressive disorder, recurrent.  Insomnia.  Over-the-counter medication abuse.  AXIS II:  Deferred.  AXIS III:  History of non-insulin-dependent diabetes mellitus.  AXIS IV:  Stressors:  Severe.  AXIS V:  GAF on discharge 60.      Evelyn Jungling, MD  Electronically Signed     SPB/MEDQ  D:  04/12/2007  T:  04/12/2007  Job:  [phone]

## 2012-05-09 ENCOUNTER — Ambulatory Visit: Payer: Self-pay | Admitting: Family Medicine

## 2012-06-08 DIAGNOSIS — Z915 Personal history of self-harm: Secondary | ICD-10-CM | POA: Insufficient documentation

## 2013-03-31 ENCOUNTER — Emergency Department: Payer: Self-pay | Admitting: Internal Medicine

## 2013-03-31 LAB — COMPREHENSIVE METABOLIC PANEL
Albumin: 3.6 g/dL (ref 3.4–5.0)
Alkaline Phosphatase: 120 U/L (ref 50–136)
Anion Gap: 5 — ABNORMAL LOW (ref 7–16)
Bilirubin,Total: 0.5 mg/dL (ref 0.2–1.0)
Calcium, Total: 9.5 mg/dL (ref 8.5–10.1)
Chloride: 106 mmol/L (ref 98–107)
Creatinine: 0.97 mg/dL (ref 0.60–1.30)
EGFR (African American): >60
EGFR (Non-African Amer.): >60
SGOT(AST): 16 U/L (ref 15–37)
SGPT (ALT): 21 U/L (ref 12–78)
Total Protein: 8.2 g/dL (ref 6.4–8.2)

## 2013-03-31 LAB — URINALYSIS, COMPLETE
Nitrite: NEGATIVE
Ph: 5 (ref 4.5–8.0)
RBC,UR: 184 /HPF (ref 0–5)

## 2013-03-31 LAB — CBC
HCT: 39.3 % (ref 35.0–47.0)
MCH: 27.1 pg (ref 26.0–34.0)
MCHC: 32.8 g/dL (ref 32.0–36.0)
MCV: 82 fL (ref 80–100)
Platelet: 281 10*3/uL (ref 150–440)
RBC: 4.76 10*6/uL (ref 3.80–5.20)
RDW: 12.8 % (ref 11.5–14.5)

## 2013-04-03 ENCOUNTER — Ambulatory Visit: Payer: Self-pay | Admitting: Family Medicine

## 2013-04-03 ENCOUNTER — Other Ambulatory Visit: Payer: Self-pay | Admitting: Urology

## 2013-04-04 ENCOUNTER — Encounter (HOSPITAL_COMMUNITY): Payer: Self-pay

## 2013-04-05 ENCOUNTER — Encounter (HOSPITAL_COMMUNITY): Payer: Self-pay
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
## 2013-04-08 ENCOUNTER — Ambulatory Visit (HOSPITAL_COMMUNITY)
Admission: RE | Admit: 2013-04-08 | Discharge: 2013-04-08 | Disposition: A | Discharge status: Home / Self Care | Payer: 59 | Source: Ambulatory Visit | Attending: Urology | Admitting: Urology

## 2013-04-08 ENCOUNTER — Encounter (HOSPITAL_COMMUNITY)
Admission: RE | Disposition: A | Discharge status: Home / Self Care | Payer: Self-pay | Source: Ambulatory Visit | Attending: Urology

## 2013-04-08 ENCOUNTER — Encounter (HOSPITAL_COMMUNITY): Payer: Self-pay | Admitting: *Deleted

## 2013-04-08 ENCOUNTER — Ambulatory Visit (HOSPITAL_COMMUNITY): Payer: 59

## 2013-04-08 DIAGNOSIS — R002 Palpitations: Secondary | ICD-10-CM | POA: Insufficient documentation

## 2013-04-08 DIAGNOSIS — G4700 Insomnia, unspecified: Secondary | ICD-10-CM | POA: Insufficient documentation

## 2013-04-08 DIAGNOSIS — Z8673 Personal history of transient ischemic attack (TIA), and cerebral infarction without residual deficits: Secondary | ICD-10-CM | POA: Insufficient documentation

## 2013-04-08 DIAGNOSIS — E8881 Metabolic syndrome: Secondary | ICD-10-CM | POA: Insufficient documentation

## 2013-04-08 DIAGNOSIS — F17200 Nicotine dependence, unspecified, uncomplicated: Secondary | ICD-10-CM | POA: Insufficient documentation

## 2013-04-08 DIAGNOSIS — E785 Hyperlipidemia, unspecified: Secondary | ICD-10-CM | POA: Insufficient documentation

## 2013-04-08 DIAGNOSIS — J309 Allergic rhinitis, unspecified: Secondary | ICD-10-CM | POA: Insufficient documentation

## 2013-04-08 DIAGNOSIS — E559 Vitamin D deficiency, unspecified: Secondary | ICD-10-CM | POA: Insufficient documentation

## 2013-04-08 DIAGNOSIS — N201 Calculus of ureter: Secondary | ICD-10-CM

## 2013-04-08 DIAGNOSIS — E282 Polycystic ovarian syndrome: Secondary | ICD-10-CM | POA: Insufficient documentation

## 2013-04-08 DIAGNOSIS — Z79899 Other long term (current) drug therapy: Secondary | ICD-10-CM | POA: Insufficient documentation

## 2013-04-08 DIAGNOSIS — F319 Bipolar disorder, unspecified: Secondary | ICD-10-CM | POA: Insufficient documentation

## 2013-04-08 DIAGNOSIS — E039 Hypothyroidism, unspecified: Secondary | ICD-10-CM | POA: Insufficient documentation

## 2013-04-08 DIAGNOSIS — F603 Borderline personality disorder: Secondary | ICD-10-CM | POA: Insufficient documentation

## 2013-04-08 DIAGNOSIS — IMO0001 Reserved for inherently not codable concepts without codable children: Secondary | ICD-10-CM | POA: Insufficient documentation

## 2013-04-08 HISTORY — DX: Cerebral infarction, unspecified: I63.9

## 2013-04-08 HISTORY — DX: Hypothyroidism, unspecified: E03.9

## 2013-04-08 HISTORY — DX: Calculus of kidney: N20.0

## 2013-04-08 LAB — BASIC METABOLIC PANEL
Chloride: 102 mEq/L (ref 96–112)
GFR calc Af Amer: 77 mL/min — ABNORMAL LOW (ref >90)
GFR calc non Af Amer: 66 mL/min — ABNORMAL LOW (ref >90)
Potassium: 4.1 mEq/L (ref 3.5–5.1)
Sodium: 138 mEq/L (ref 135–145)

## 2013-04-08 SURGERY — LITHOTRIPSY, ESWL
Anesthesia: LOCAL | Laterality: Left

## 2013-04-08 MED ORDER — DEXTROSE-NACL 5-0.45 % IV SOLN
INTRAVENOUS | Status: DC
  Administered 2013-04-08: 07:00:00 via INTRAVENOUS

## 2013-04-08 MED ORDER — DIPHENHYDRAMINE HCL 25 MG PO CAPS
25.0000 mg | ORAL_CAPSULE | ORAL | Status: AC
  Administered 2013-04-08: 25 mg via ORAL
  Filled 2013-04-08: qty 1

## 2013-04-08 MED ORDER — DIAZEPAM 5 MG PO TABS
10.0000 mg | ORAL_TABLET | ORAL | Status: AC
  Administered 2013-04-08: 10 mg via ORAL
  Filled 2013-04-08: qty 2

## 2013-04-08 MED ORDER — CIPROFLOXACIN HCL 500 MG PO TABS
500.0000 mg | ORAL_TABLET | ORAL | Status: AC
  Administered 2013-04-08: 500 mg via ORAL
  Filled 2013-04-08: qty 1

## 2013-04-08 MED ORDER — OXYCODONE-ACETAMINOPHEN 5-325 MG PO TABS: 1.0000 | ORAL_TABLET | Freq: Four times a day (QID) | ORAL | Status: DC | PRN

## 2013-04-08 NOTE — Interval H&P Note (Signed)
History and Physical Interval Note:  04/08/2013 7:51 AM  Evelyn Cunningham  has presented today for surgery, with the diagnosis of Left Ureteral Calculus  The various methods of treatment have been discussed with the patient and family. After consideration of risks, benefits and other options for treatment, the patient has consented to  Procedure(s): LEFT EXTRACORPOREAL SHOCK WAVE LITHOTRIPSY (ESWL) (Left) as a surgical intervention .  The patient's history has been reviewed, patient examined, no change in status, stable for surgery.  I have reviewed the patient's chart and labs.  Questions were answered to the patient's satisfaction.     Greidys Deland S

## 2013-04-08 NOTE — Op Note (Signed)
See [facility] OP note scanned into chart. 

## 2013-10-05 ENCOUNTER — Emergency Department: Payer: Self-pay | Admitting: Emergency Medicine

## 2013-10-05 LAB — CBC
HCT: 34.1 % — ABNORMAL LOW (ref 35.0–47.0)
HGB: 11.4 g/dL — ABNORMAL LOW (ref 12.0–16.0)
MCH: 27.5 pg (ref 26.0–34.0)
MCHC: 33.3 g/dL (ref 32.0–36.0)
MCV: 82 fL (ref 80–100)
Platelet: 273 10*3/uL (ref 150–440)
RBC: 4.14 10*6/uL (ref 3.80–5.20)
RDW: 13.4 % (ref 11.5–14.5)
WBC: 15.5 10*3/uL — ABNORMAL HIGH (ref 3.6–11.0)

## 2013-10-05 LAB — COMPREHENSIVE METABOLIC PANEL
ALK PHOS: 86 U/L
ALT: 20 U/L (ref 12–78)
AST: 15 U/L (ref 15–37)
Albumin: 3.1 g/dL — ABNORMAL LOW (ref 3.4–5.0)
Anion Gap: 5 — ABNORMAL LOW (ref 7–16)
BILIRUBIN TOTAL: 0.6 mg/dL (ref 0.2–1.0)
BUN: 16 mg/dL (ref 7–18)
CHLORIDE: 108 mmol/L — AB (ref 98–107)
CO2: 28 mmol/L (ref 21–32)
CREATININE: 0.88 mg/dL (ref 0.60–1.30)
Calcium, Total: 8.9 mg/dL (ref 8.5–10.1)
EGFR (African American): >60
EGFR (Non-African Amer.): >60
GLUCOSE: 134 mg/dL — AB (ref 65–99)
Osmolality: 284 (ref 275–301)
POTASSIUM: 3.5 mmol/L (ref 3.5–5.1)
SODIUM: 141 mmol/L (ref 136–145)
TOTAL PROTEIN: 7.5 g/dL (ref 6.4–8.2)

## 2013-10-05 LAB — TROPONIN I

## 2013-10-05 LAB — PRO B NATRIURETIC PEPTIDE: B-Type Natriuretic Peptide: 1129 pg/mL — ABNORMAL HIGH (ref 0–125)

## 2014-05-16 LAB — BASIC METABOLIC PANEL
BUN: 18 mg/dL (ref 4–21)
CREATININE: 0.8 mg/dL (ref <=1.1)
Glucose: 109 mg/dL
POTASSIUM: 4.4 mmol/L (ref 3.4–5.3)
SODIUM: 138 mmol/L (ref 137–147)

## 2014-05-16 LAB — HEPATIC FUNCTION PANEL
ALT: 10 U/L (ref 7–35)
AST: 14 U/L (ref 13–35)
Alkaline Phosphatase: 90 U/L (ref 25–125)
BILIRUBIN, TOTAL: 0.6 mg/dL

## 2014-05-16 LAB — HEMOGLOBIN A1C: HEMOGLOBIN A1C: 6 % (ref 4.0–6.0)

## 2014-05-20 IMAGING — CR DG ABDOMEN 1V
1 series · 1 of 1 positions shown · non-contrast
Comparison: None.

CLINICAL DATA: Pre lithotripsy radiograph.  Left flank pain.

ABDOMEN - 1 VIEW

[t abdomen supine]
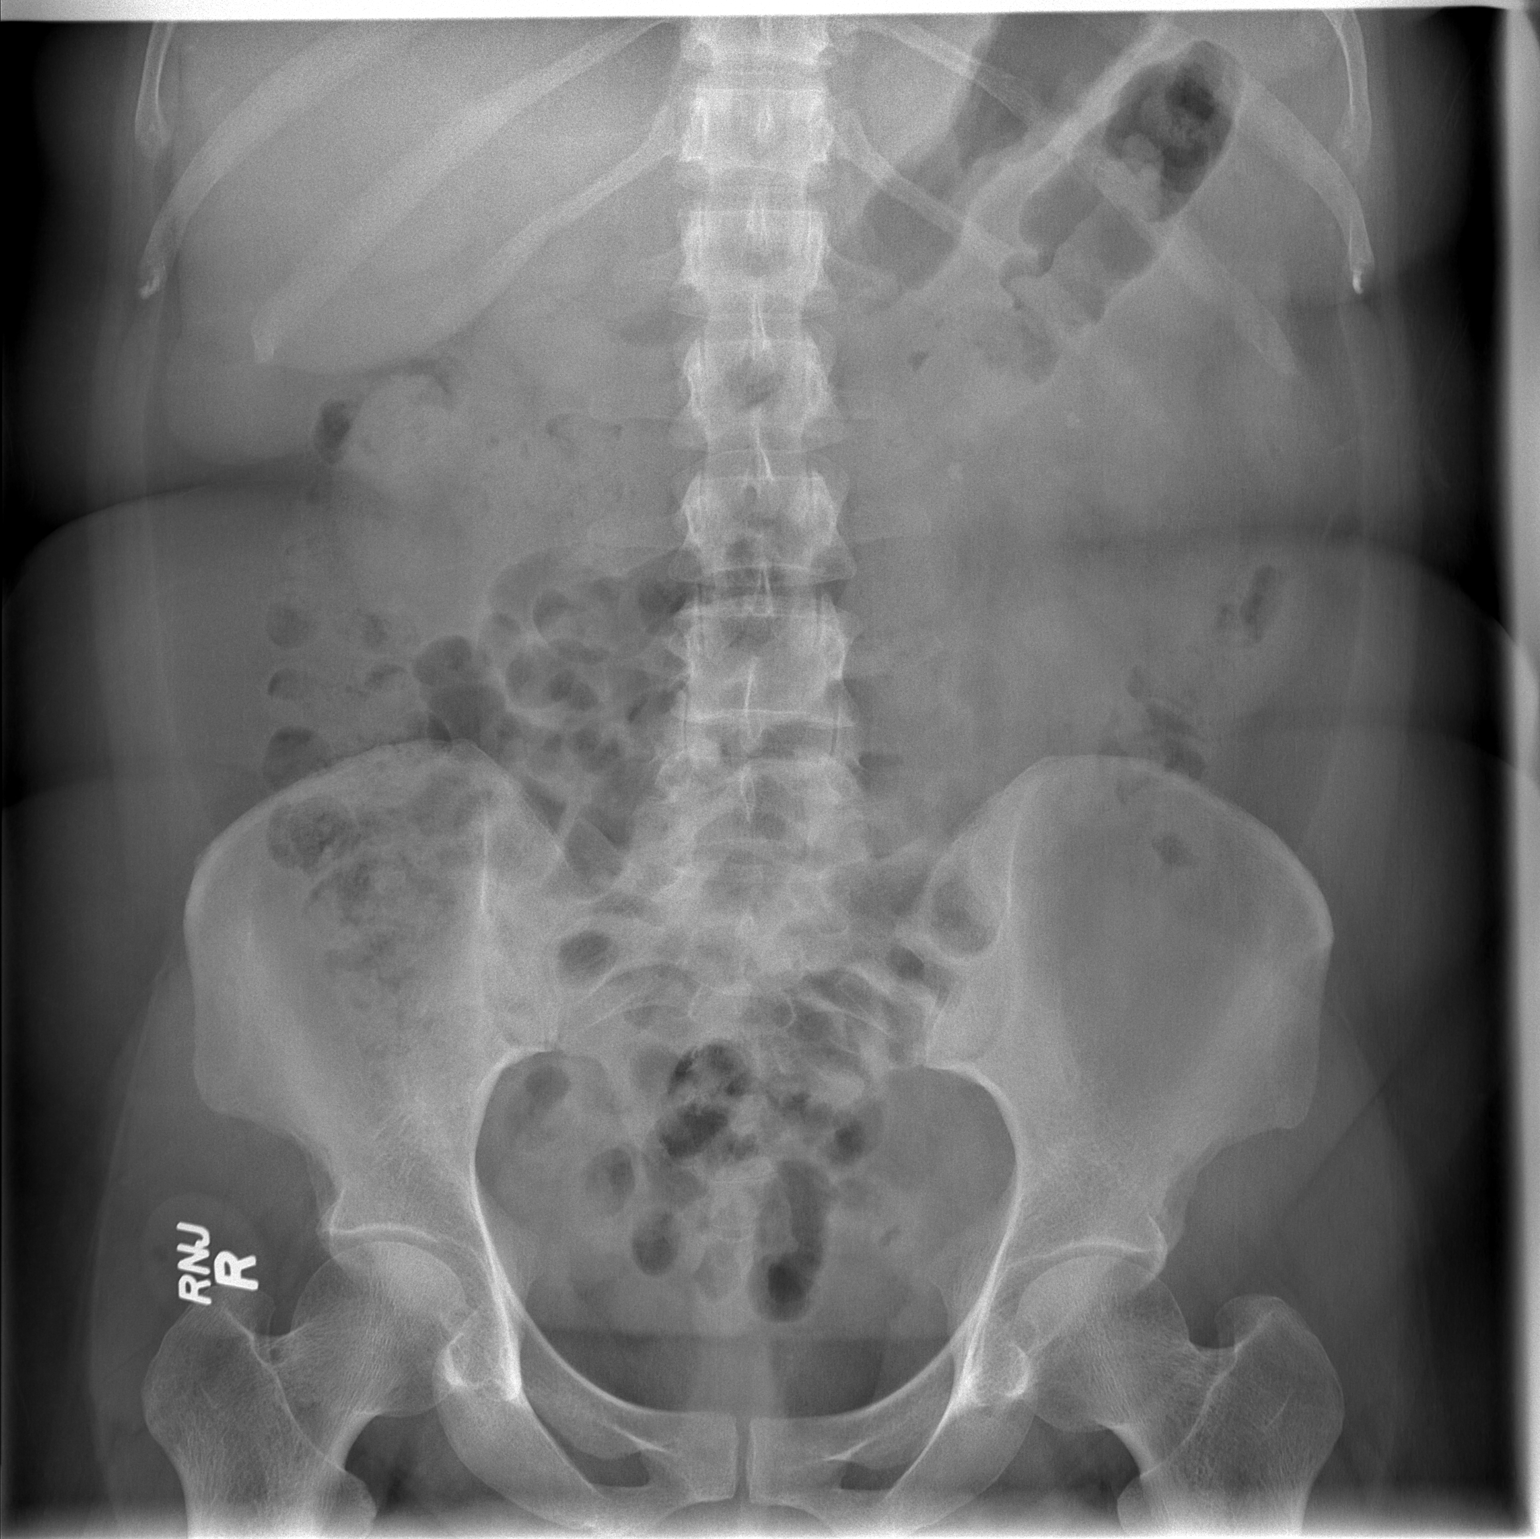

[1 of 1 positions shown; findings below may reference images not displayed]

FINDINGS: Dual 6 mm left renal calculi are present.  The more
medial of which appears to be in the region of the left UPJ.  No
right-sided renal calculi.  No distal left ureteral stones are
identified.  Bowel gas pattern appears normal.
IMPRESSION: Two calcifications in the region of the left kidney, probably
representing collecting system and left UPJ calculi.

## 2014-08-15 LAB — CBC AND DIFFERENTIAL
HCT: 41 % (ref 36–46)
Hemoglobin: 13.4 g/dL (ref 12.0–16.0)
NEUTROS ABS: 6 /uL
Platelets: 336 10*3/uL (ref 150–399)
WBC: 9.9 10*3/mL

## 2014-08-15 LAB — LIPID PANEL
Cholesterol: 245 mg/dL — AB (ref 0–200)
HDL: 64 mg/dL (ref 35–70)
LDL Cholesterol: 151 mg/dL
LDL/HDL RATIO: 2.4
TRIGLYCERIDES: 148 mg/dL (ref 40–160)

## 2014-08-15 LAB — TSH: TSH: 2.67 u[IU]/mL (ref <=5.90)

## 2015-02-11 ENCOUNTER — Telehealth: Payer: Self-pay | Admitting: Family Medicine

## 2015-02-11 NOTE — Telephone Encounter (Signed)
Pt is requesting a refill on Qsymia. The bottle says authorization is required. Please leave a detailed message when you call back. thankyou

## 2015-03-13 ENCOUNTER — Encounter: Payer: Self-pay | Admitting: Family Medicine

## 2015-03-13 ENCOUNTER — Ambulatory Visit (INDEPENDENT_AMBULATORY_CARE_PROVIDER_SITE_OTHER): Payer: 59 | Admitting: Family Medicine

## 2015-03-13 VITALS — BP 124/76 | HR 77 | Temp 98.5°F | Resp 16 | Ht 60.0 in | Wt 163.1 lb

## 2015-03-13 DIAGNOSIS — E669 Obesity, unspecified: Secondary | ICD-10-CM | POA: Diagnosis not present

## 2015-03-13 DIAGNOSIS — Z113 Encounter for screening for infections with a predominantly sexual mode of transmission: Secondary | ICD-10-CM | POA: Diagnosis not present

## 2015-03-13 DIAGNOSIS — E038 Other specified hypothyroidism: Secondary | ICD-10-CM | POA: Diagnosis not present

## 2015-03-13 DIAGNOSIS — F310 Bipolar disorder, current episode hypomanic: Secondary | ICD-10-CM

## 2015-03-13 DIAGNOSIS — N200 Calculus of kidney: Secondary | ICD-10-CM | POA: Insufficient documentation

## 2015-03-13 DIAGNOSIS — F319 Bipolar disorder, unspecified: Secondary | ICD-10-CM | POA: Insufficient documentation

## 2015-03-13 DIAGNOSIS — M797 Fibromyalgia: Secondary | ICD-10-CM | POA: Insufficient documentation

## 2015-03-13 DIAGNOSIS — Z8669 Personal history of other diseases of the nervous system and sense organs: Secondary | ICD-10-CM | POA: Insufficient documentation

## 2015-03-13 DIAGNOSIS — M545 Low back pain, unspecified: Secondary | ICD-10-CM | POA: Insufficient documentation

## 2015-03-13 DIAGNOSIS — E559 Vitamin D deficiency, unspecified: Secondary | ICD-10-CM

## 2015-03-13 DIAGNOSIS — E039 Hypothyroidism, unspecified: Secondary | ICD-10-CM | POA: Insufficient documentation

## 2015-03-13 DIAGNOSIS — E8881 Metabolic syndrome: Secondary | ICD-10-CM | POA: Insufficient documentation

## 2015-03-13 DIAGNOSIS — J40 Bronchitis, not specified as acute or chronic: Secondary | ICD-10-CM

## 2015-03-13 DIAGNOSIS — M5412 Radiculopathy, cervical region: Secondary | ICD-10-CM | POA: Insufficient documentation

## 2015-03-13 DIAGNOSIS — E282 Polycystic ovarian syndrome: Secondary | ICD-10-CM | POA: Insufficient documentation

## 2015-03-13 DIAGNOSIS — Z79899 Other long term (current) drug therapy: Secondary | ICD-10-CM

## 2015-03-13 DIAGNOSIS — Z8701 Personal history of pneumonia (recurrent): Secondary | ICD-10-CM | POA: Insufficient documentation

## 2015-03-13 DIAGNOSIS — E785 Hyperlipidemia, unspecified: Secondary | ICD-10-CM | POA: Insufficient documentation

## 2015-03-13 DIAGNOSIS — F603 Borderline personality disorder: Secondary | ICD-10-CM | POA: Insufficient documentation

## 2015-03-13 DIAGNOSIS — Z8781 Personal history of (healed) traumatic fracture: Secondary | ICD-10-CM | POA: Insufficient documentation

## 2015-03-13 DIAGNOSIS — E538 Deficiency of other specified B group vitamins: Secondary | ICD-10-CM

## 2015-03-13 DIAGNOSIS — F5104 Psychophysiologic insomnia: Secondary | ICD-10-CM | POA: Insufficient documentation

## 2015-03-13 DIAGNOSIS — B360 Pityriasis versicolor: Secondary | ICD-10-CM | POA: Insufficient documentation

## 2015-03-13 DIAGNOSIS — Z8673 Personal history of transient ischemic attack (TIA), and cerebral infarction without residual deficits: Secondary | ICD-10-CM | POA: Insufficient documentation

## 2015-03-13 DIAGNOSIS — J302 Other seasonal allergic rhinitis: Secondary | ICD-10-CM | POA: Insufficient documentation

## 2015-03-13 DIAGNOSIS — Z90710 Acquired absence of both cervix and uterus: Secondary | ICD-10-CM | POA: Insufficient documentation

## 2015-03-13 DIAGNOSIS — Z91410 Personal history of adult physical and sexual abuse: Secondary | ICD-10-CM | POA: Insufficient documentation

## 2015-03-13 MED ORDER — LEVOTHYROXINE SODIUM 25 MCG PO TABS: 25.0000 ug | ORAL_TABLET | Freq: Every day | ORAL | Status: DC

## 2015-03-13 MED ORDER — MOMETASONE FURO-FORMOTEROL FUM 200-5 MCG/ACT IN AERO: 2.0000 | INHALATION_SPRAY | Freq: Two times a day (BID) | RESPIRATORY_TRACT | Status: DC

## 2015-03-13 MED ORDER — PREMARIN 0.625 MG/GM VA CREA: 1.0000 | TOPICAL_CREAM | Freq: Every day | VAGINAL | Status: DC

## 2015-03-13 MED ORDER — METFORMIN HCL 500 MG PO TABS: 500.0000 mg | ORAL_TABLET | Freq: Two times a day (BID) | ORAL | Status: DC

## 2015-03-13 MED ORDER — PHENTERMINE-TOPIRAMATE ER 15-92 MG PO CP24: 1.0000 | ORAL_CAPSULE | Freq: Every day | ORAL | Status: DC

## 2015-03-13 MED ORDER — AZITHROMYCIN 250 MG PO TABS: 500.0000 mg | ORAL_TABLET | Freq: Once | ORAL | Status: DC
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
## 2015-03-14 ENCOUNTER — Other Ambulatory Visit
Admission: RE | Admit: 2015-03-14 | Disposition: A | Discharge status: Home / Self Care | Payer: Commercial Managed Care - PPO | Source: Ambulatory Visit | Attending: Family Medicine | Admitting: Family Medicine

## 2015-03-17 ENCOUNTER — Other Ambulatory Visit: Payer: Self-pay | Admitting: Family Medicine

## 2015-03-20 LAB — GC/CHLAMYDIA PROBE AMP
CHLAMYDIA, DNA PROBE: NEGATIVE
NEISSERIA GONORRHOEAE BY PCR: NEGATIVE

## 2015-03-20 LAB — SPECIMEN STATUS REPORT

## 2015-03-24 ENCOUNTER — Other Ambulatory Visit: Payer: Self-pay | Admitting: Family Medicine

## 2015-03-24 DIAGNOSIS — E039 Hypothyroidism, unspecified: Secondary | ICD-10-CM

## 2015-03-24 LAB — COMPREHENSIVE METABOLIC PANEL
ALT: 12 IU/L (ref 0–32)
AST: 12 IU/L (ref 0–40)
Albumin/Globulin Ratio: 1.3 (ref 1.1–2.5)
Albumin: 4.3 g/dL (ref 3.5–5.5)
Alkaline Phosphatase: 87 IU/L (ref 39–117)
BUN/Creatinine Ratio: 20 (ref 9–23)
BUN: 15 mg/dL (ref 6–24)
Bilirubin Total: 0.5 mg/dL (ref 0.0–1.2)
CALCIUM: 9.9 mg/dL (ref 8.7–10.2)
CO2: 24 mmol/L (ref 18–29)
Chloride: 101 mmol/L (ref 97–108)
Creatinine, Ser: 0.74 mg/dL (ref 0.57–1.00)
GFR calc Af Amer: 116 mL/min/{1.73_m2} (ref >59)
GFR calc non Af Amer: 100 mL/min/{1.73_m2} (ref >59)
GLOBULIN, TOTAL: 3.3 g/dL (ref 1.5–4.5)
Glucose: 109 mg/dL — ABNORMAL HIGH (ref 65–99)
Potassium: 4.3 mmol/L (ref 3.5–5.2)
Sodium: 142 mmol/L (ref 134–144)
TOTAL PROTEIN: 7.6 g/dL (ref 6.0–8.5)

## 2015-03-24 LAB — CBC WITH DIFFERENTIAL/PLATELET
Basophils Absolute: 0.1 10*3/uL (ref 0.0–0.2)
Basos: 1 %
EOS (ABSOLUTE): 0.2 10*3/uL (ref 0.0–0.4)
Eos: 2 %
Hematocrit: 39.9 % (ref 34.0–46.6)
Hemoglobin: 13.3 g/dL (ref 11.1–15.9)
Immature Grans (Abs): 0 10*3/uL (ref 0.0–0.1)
Immature Granulocytes: 0 %
LYMPHS ABS: 2.7 10*3/uL (ref 0.7–3.1)
Lymphs: 26 %
MCH: 27.6 pg (ref 26.6–33.0)
MCHC: 33.3 g/dL (ref 31.5–35.7)
MCV: 83 fL (ref 79–97)
MONOCYTES: 6 %
MONOS ABS: 0.6 10*3/uL (ref 0.1–0.9)
NEUTROS PCT: 65 %
Neutrophils Absolute: 6.9 10*3/uL (ref 1.4–7.0)
PLATELETS: 334 10*3/uL (ref 150–379)
RBC: 4.82 x10E6/uL (ref 3.77–5.28)
RDW: 13.4 % (ref 12.3–15.4)
WBC: 10.5 10*3/uL (ref 3.4–10.8)

## 2015-03-24 LAB — LIPID PANEL
CHOLESTEROL TOTAL: 253 mg/dL — AB (ref 100–199)
Chol/HDL Ratio: 4.5 ratio units — ABNORMAL HIGH (ref 0.0–4.4)
HDL: 56 mg/dL (ref >39)
LDL Calculated: 159 mg/dL — ABNORMAL HIGH (ref 0–99)
TRIGLYCERIDES: 192 mg/dL — AB (ref 0–149)
VLDL Cholesterol Cal: 38 mg/dL (ref 5–40)

## 2015-03-24 LAB — VITAMIN D 25 HYDROXY (VIT D DEFICIENCY, FRACTURES): VIT D 25 HYDROXY: 29 ng/mL — AB (ref 30.0–100.0)

## 2015-03-24 LAB — HIV ANTIBODY (ROUTINE TESTING W REFLEX): HIV Screen 4th Generation wRfx: NONREACTIVE

## 2015-03-24 LAB — TSH: TSH: 4.76 u[IU]/mL — AB (ref 0.450–4.500)

## 2015-03-24 LAB — VITAMIN B12: VITAMIN B 12: 1494 pg/mL — AB (ref 211–946)

## 2015-03-24 LAB — RPR: RPR Ser Ql: NONREACTIVE

## 2015-03-26 ENCOUNTER — Telehealth: Payer: Self-pay | Admitting: Family Medicine

## 2015-03-26 NOTE — Telephone Encounter (Signed)
Labs were faxed to [facility].

## 2015-03-26 NOTE — Telephone Encounter (Signed)
Patient received lab results on yesterday and is requesting that they be faxed to [facility] dr. Burtis JunesJessica Knight Harriston [phone]

## 2015-06-12 ENCOUNTER — Ambulatory Visit: Payer: 59 | Admitting: Family Medicine

## 2015-07-03 ENCOUNTER — Ambulatory Visit: Payer: 59 | Admitting: Family Medicine

## 2015-07-10 ENCOUNTER — Ambulatory Visit: Payer: 59 | Admitting: Family Medicine

## 2015-07-17 ENCOUNTER — Ambulatory Visit: Payer: 59 | Admitting: Family Medicine

## 2015-08-11 ENCOUNTER — Telehealth: Payer: Self-pay

## 2015-08-11 NOTE — Telephone Encounter (Signed)
Tried calling pt and did not get an answer.

## 2015-08-11 NOTE — Telephone Encounter (Signed)
Needs f/u for weight loss med

## 2015-08-28 ENCOUNTER — Encounter: Payer: Self-pay | Admitting: Family Medicine

## 2015-08-28 ENCOUNTER — Ambulatory Visit (INDEPENDENT_AMBULATORY_CARE_PROVIDER_SITE_OTHER): Payer: 59 | Admitting: Family Medicine

## 2015-08-28 VITALS — BP 118/66 | HR 105 | Temp 98.0°F | Resp 18 | Ht 60.0 in | Wt 178.8 lb

## 2015-08-28 DIAGNOSIS — E038 Other specified hypothyroidism: Secondary | ICD-10-CM | POA: Diagnosis not present

## 2015-08-28 DIAGNOSIS — Z79899 Other long term (current) drug therapy: Secondary | ICD-10-CM

## 2015-08-28 DIAGNOSIS — E538 Deficiency of other specified B group vitamins: Secondary | ICD-10-CM

## 2015-08-28 DIAGNOSIS — E8881 Metabolic syndrome: Secondary | ICD-10-CM

## 2015-08-28 DIAGNOSIS — E669 Obesity, unspecified: Secondary | ICD-10-CM

## 2015-08-28 DIAGNOSIS — F310 Bipolar disorder, current episode hypomanic: Secondary | ICD-10-CM

## 2015-08-28 DIAGNOSIS — Z23 Encounter for immunization: Secondary | ICD-10-CM | POA: Diagnosis not present

## 2015-08-28 DIAGNOSIS — E785 Hyperlipidemia, unspecified: Secondary | ICD-10-CM | POA: Diagnosis not present
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
## 2015-08-29 LAB — HEMOGLOBIN A1C
Est. average glucose Bld gHb Est-mCnc: 131 mg/dL
HEMOGLOBIN A1C: 6.2 % — AB (ref 4.8–5.6)

## 2015-08-29 LAB — CBC WITH DIFFERENTIAL/PLATELET
BASOS: 1 %
Basophils Absolute: 0.1 10*3/uL (ref 0.0–0.2)
EOS (ABSOLUTE): 0.2 10*3/uL (ref 0.0–0.4)
EOS: 2 %
HEMATOCRIT: 39.4 % (ref 34.0–46.6)
HEMOGLOBIN: 13.4 g/dL (ref 11.1–15.9)
Immature Grans (Abs): 0 10*3/uL (ref 0.0–0.1)
Immature Granulocytes: 0 %
LYMPHS ABS: 3.2 10*3/uL — AB (ref 0.7–3.1)
Lymphs: 31 %
MCH: 27.9 pg (ref 26.6–33.0)
MCHC: 34 g/dL (ref 31.5–35.7)
MCV: 82 fL (ref 79–97)
MONOCYTES: 7 %
Monocytes Absolute: 0.7 10*3/uL (ref 0.1–0.9)
NEUTROS ABS: 6.1 10*3/uL (ref 1.4–7.0)
Neutrophils: 59 %
Platelets: 318 10*3/uL (ref 150–379)
RBC: 4.81 x10E6/uL (ref 3.77–5.28)
RDW: 12.9 % (ref 12.3–15.4)
WBC: 10.2 10*3/uL (ref 3.4–10.8)

## 2015-08-29 LAB — VITAMIN B12: VITAMIN B 12: 1525 pg/mL — AB (ref 211–946)

## 2015-08-29 LAB — COMPREHENSIVE METABOLIC PANEL
ALBUMIN: 4.1 g/dL (ref 3.5–5.5)
ALK PHOS: 91 IU/L (ref 39–117)
ALT: 14 IU/L (ref 0–32)
AST: 14 IU/L (ref 0–40)
Albumin/Globulin Ratio: 1.2 (ref 1.1–2.5)
BILIRUBIN TOTAL: 0.6 mg/dL (ref 0.0–1.2)
BUN / CREAT RATIO: 18 (ref 9–23)
BUN: 13 mg/dL (ref 6–24)
CHLORIDE: 98 mmol/L (ref 96–106)
CO2: 24 mmol/L (ref 18–29)
Calcium: 10.6 mg/dL — ABNORMAL HIGH (ref 8.7–10.2)
Creatinine, Ser: 0.71 mg/dL (ref 0.57–1.00)
GFR calc Af Amer: 121 mL/min/{1.73_m2} (ref >59)
GFR calc non Af Amer: 105 mL/min/{1.73_m2} (ref >59)
GLUCOSE: 89 mg/dL (ref 65–99)
Globulin, Total: 3.5 g/dL (ref 1.5–4.5)
Potassium: 4.6 mmol/L (ref 3.5–5.2)
Sodium: 138 mmol/L (ref 134–144)
Total Protein: 7.6 g/dL (ref 6.0–8.5)

## 2015-08-29 LAB — TSH: TSH: 3.36 u[IU]/mL (ref 0.450–4.500)

## 2015-10-09 ENCOUNTER — Ambulatory Visit: Payer: 59 | Admitting: Family Medicine

## 2015-10-18 ENCOUNTER — Other Ambulatory Visit: Payer: Self-pay | Admitting: Family Medicine

## 2016-01-19 ENCOUNTER — Other Ambulatory Visit: Payer: Self-pay | Admitting: Family Medicine

## 2016-01-19 NOTE — Telephone Encounter (Signed)
Patient informed and appointment made for 02-19-16

## 2016-02-19 ENCOUNTER — Encounter: Payer: Self-pay | Admitting: Family Medicine

## 2016-02-19 ENCOUNTER — Ambulatory Visit (INDEPENDENT_AMBULATORY_CARE_PROVIDER_SITE_OTHER): Payer: 59 | Admitting: Family Medicine

## 2016-02-19 VITALS — BP 118/72 | HR 110 | Temp 99.4°F | Resp 18 | Ht 60.0 in | Wt 179.2 lb

## 2016-02-19 DIAGNOSIS — E669 Obesity, unspecified: Secondary | ICD-10-CM

## 2016-02-19 DIAGNOSIS — E8881 Metabolic syndrome: Secondary | ICD-10-CM

## 2016-02-19 DIAGNOSIS — E785 Hyperlipidemia, unspecified: Secondary | ICD-10-CM | POA: Diagnosis not present

## 2016-02-19 DIAGNOSIS — Z79899 Other long term (current) drug therapy: Secondary | ICD-10-CM | POA: Diagnosis not present

## 2016-02-19 DIAGNOSIS — E038 Other specified hypothyroidism: Secondary | ICD-10-CM

## 2016-02-19 DIAGNOSIS — E538 Deficiency of other specified B group vitamins: Secondary | ICD-10-CM | POA: Diagnosis not present

## 2016-02-19 DIAGNOSIS — F310 Bipolar disorder, current episode hypomanic: Secondary | ICD-10-CM

## 2016-02-19 DIAGNOSIS — E559 Vitamin D deficiency, unspecified: Secondary | ICD-10-CM

## 2016-02-19 MED ORDER — LEVOTHYROXINE SODIUM 25 MCG PO TABS: 25.0000 ug | ORAL_TABLET | Freq: Every day | ORAL | Status: DC

## 2016-02-19 MED ORDER — METFORMIN HCL 500 MG PO TABS: 500.0000 mg | ORAL_TABLET | Freq: Two times a day (BID) | ORAL | Status: DC

## 2016-02-19 MED ORDER — LORCASERIN HCL ER 20 MG PO TB24: 1.0000 | ORAL_TABLET | Freq: Every day | ORAL | Status: DC

## 2016-02-19 MED ORDER — CYANOCOBALAMIN 1000 MCG/ML IJ SOLN
1000.0000 ug | Freq: Once | INTRAMUSCULAR | Status: AC
  Administered 2016-02-19: 1000 ug via INTRAMUSCULAR
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
## 2016-02-23 ENCOUNTER — Telehealth: Payer: Self-pay | Admitting: Family Medicine

## 2016-02-23 NOTE — Telephone Encounter (Signed)
Pt states that the Belviq needs a prior-authorization. # is [phone] [id number]

## 2016-02-24 NOTE — Telephone Encounter (Signed)
Patient notified

## 2016-03-03 ENCOUNTER — Telehealth: Payer: Self-pay | Admitting: Family Medicine

## 2016-03-03 DIAGNOSIS — E669 Obesity, unspecified: Secondary | ICD-10-CM

## 2016-03-03 NOTE — Telephone Encounter (Signed)
Pt states she was called and understands her Sharyn CreamerBelviq is covered under her insurance and she wants to know if she could get a 90 day supply to [facility] [address] Pt states she already knows her insurance will cover for 90 days.

## 2016-03-04 MED ORDER — LORCASERIN HCL ER 20 MG PO TB24: 1.0000 | ORAL_TABLET | Freq: Every day | ORAL | Status: DC

## 2016-03-04 NOTE — Telephone Encounter (Signed)
Changed to 90 days.

## 2016-03-18 ENCOUNTER — Other Ambulatory Visit: Payer: Self-pay

## 2016-03-18 DIAGNOSIS — E038 Other specified hypothyroidism: Secondary | ICD-10-CM

## 2016-03-18 DIAGNOSIS — E8881 Metabolic syndrome: Secondary | ICD-10-CM

## 2016-03-18 NOTE — Telephone Encounter (Signed)
Patient called requesting a refill of her Metformin 500mg  and her Levothyroxine 25mcg.  Refill request was sent to Dr. Alba CoryKrichna Sowles for approval and submission.

## 2016-03-20 ENCOUNTER — Other Ambulatory Visit: Payer: Self-pay | Admitting: Family Medicine

## 2016-03-21 NOTE — Telephone Encounter (Signed)
Patient requesting refill. 

## 2016-03-24 ENCOUNTER — Other Ambulatory Visit: Payer: Self-pay | Admitting: Family Medicine

## 2016-03-24 ENCOUNTER — Telehealth: Payer: Self-pay | Admitting: Family Medicine

## 2016-03-24 DIAGNOSIS — E039 Hypothyroidism, unspecified: Secondary | ICD-10-CM

## 2016-03-24 NOTE — Telephone Encounter (Signed)
Per Dr Carlynn PurlSowles please print all her labs that were ordered for [facility] to [facility]

## 2016-03-25 LAB — TSH: TSH: 5.22 m[IU]/L — AB

## 2016-03-25 NOTE — Telephone Encounter (Signed)
Patient requesting refill. 

## 2016-03-25 NOTE — Telephone Encounter (Signed)
Labs has been changed and printed.

## 2016-03-28 ENCOUNTER — Other Ambulatory Visit: Payer: Self-pay | Admitting: Family Medicine

## 2016-03-28 ENCOUNTER — Telehealth: Payer: Self-pay | Admitting: Family Medicine

## 2016-03-28 MED ORDER — LEVOTHYROXINE SODIUM 25 MCG PO TABS: 25.0000 ug | ORAL_TABLET | Freq: Every day | ORAL | Status: DC

## 2016-03-28 NOTE — Telephone Encounter (Signed)
PT SAID THAT SHE IS NEEDING 90 DAY SUPPLY OF THYROID MEDS. SAYS THAT YOU ALL HAD CALLED IN JUST A 30 DAY SUPPLY. SHE IS NOT NEEDING A 90  [facility] [address] NOT SURE WHY 30 DAY WAS CALLED IN INSTEAD OF 90 DAY. SHE HAS HAD HER BLOOD WORK ON Friday AND THEY GAVE HER RESULTS THIS MORNING.

## 2016-06-07 ENCOUNTER — Other Ambulatory Visit: Payer: Self-pay | Admitting: Family Medicine

## 2016-06-07 ENCOUNTER — Ambulatory Visit (INDEPENDENT_AMBULATORY_CARE_PROVIDER_SITE_OTHER): Payer: 59 | Admitting: Family Medicine

## 2016-06-07 ENCOUNTER — Encounter: Payer: Self-pay | Admitting: Family Medicine

## 2016-06-07 VITALS — BP 122/62 | Temp 98.4°F | Wt 176.3 lb

## 2016-06-07 DIAGNOSIS — R072 Precordial pain: Secondary | ICD-10-CM | POA: Diagnosis not present

## 2016-06-07 DIAGNOSIS — Z23 Encounter for immunization: Secondary | ICD-10-CM

## 2016-06-07 DIAGNOSIS — E669 Obesity, unspecified: Secondary | ICD-10-CM

## 2016-06-07 DIAGNOSIS — E8881 Metabolic syndrome: Secondary | ICD-10-CM | POA: Diagnosis not present

## 2016-06-07 DIAGNOSIS — F310 Bipolar disorder, current episode hypomanic: Secondary | ICD-10-CM | POA: Diagnosis not present

## 2016-06-07 DIAGNOSIS — Z79899 Other long term (current) drug therapy: Secondary | ICD-10-CM | POA: Diagnosis not present

## 2016-06-07 DIAGNOSIS — E538 Deficiency of other specified B group vitamins: Secondary | ICD-10-CM

## 2016-06-07 DIAGNOSIS — E785 Hyperlipidemia, unspecified: Secondary | ICD-10-CM

## 2016-06-07 DIAGNOSIS — E039 Hypothyroidism, unspecified: Secondary | ICD-10-CM | POA: Diagnosis not present
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
## 2016-06-07 NOTE — Telephone Encounter (Signed)
[id number].

## 2016-06-08 LAB — CBC WITH DIFFERENTIAL/PLATELET
BASOS PCT: 0 %
Basophils Absolute: 0 cells/uL (ref 0–200)
EOS PCT: 2 %
Eosinophils Absolute: 242 cells/uL (ref 15–500)
HCT: 43 % (ref 35.0–45.0)
HEMOGLOBIN: 14.2 g/dL (ref 11.7–15.5)
LYMPHS ABS: 3509 {cells}/uL (ref 850–3900)
Lymphocytes Relative: 29 %
MCH: 27.4 pg (ref 27.0–33.0)
MCHC: 33 g/dL (ref 32.0–36.0)
MCV: 83 fL (ref 80.0–100.0)
MPV: 11.4 fL (ref 7.5–12.5)
Monocytes Absolute: 847 cells/uL (ref 200–950)
Monocytes Relative: 7 %
NEUTROS ABS: 7502 {cells}/uL (ref 1500–7800)
Neutrophils Relative %: 62 %
Platelets: 309 10*3/uL (ref 140–400)
RBC: 5.18 MIL/uL — AB (ref 3.80–5.10)
RDW: 13.5 % (ref 11.0–15.0)
WBC: 12.1 10*3/uL — AB (ref 3.8–10.8)

## 2016-06-08 LAB — COMPLETE METABOLIC PANEL WITH GFR
ALBUMIN: 4.3 g/dL (ref 3.6–5.1)
ALK PHOS: 81 U/L (ref 33–115)
ALT: 13 U/L (ref 6–29)
AST: 13 U/L (ref 10–30)
BUN: 15 mg/dL (ref 7–25)
CALCIUM: 10.2 mg/dL (ref 8.6–10.2)
CO2: 21 mmol/L (ref 20–31)
Chloride: 104 mmol/L (ref 98–110)
Creat: 0.96 mg/dL (ref 0.50–1.10)
GFR, EST NON AFRICAN AMERICAN: 73 mL/min (ref >=60)
GFR, Est African American: 84 mL/min (ref >=60)
Glucose, Bld: 149 mg/dL — ABNORMAL HIGH (ref 65–99)
POTASSIUM: 4.2 mmol/L (ref 3.5–5.3)
SODIUM: 139 mmol/L (ref 135–146)
Total Bilirubin: 0.6 mg/dL (ref 0.2–1.2)
Total Protein: 7.7 g/dL (ref 6.1–8.1)

## 2016-06-08 LAB — TSH: TSH: 2.87 m[IU]/L

## 2016-06-08 LAB — LIPID PANEL
CHOL/HDL RATIO: 4.9 ratio (ref <=5.0)
CHOLESTEROL: 273 mg/dL — AB (ref 125–200)
HDL: 56 mg/dL (ref >=46)
LDL Cholesterol: 140 mg/dL — ABNORMAL HIGH (ref <130)
Triglycerides: 384 mg/dL — ABNORMAL HIGH (ref <150)
VLDL: 77 mg/dL — ABNORMAL HIGH (ref <30)

## 2016-06-08 LAB — HEMOGLOBIN A1C
HEMOGLOBIN A1C: 5.8 % — AB (ref <5.7)
Mean Plasma Glucose: 120 mg/dL

## 2016-06-08 LAB — VITAMIN B12: VITAMIN B 12: 699 pg/mL (ref 200–1100)

## 2016-06-09 ENCOUNTER — Other Ambulatory Visit: Payer: Self-pay | Admitting: Family Medicine

## 2016-06-09 LAB — C-REACTIVE PROTEIN: CRP: 10.9 mg/L — AB (ref <8.0)

## 2016-06-09 MED ORDER — LEVOTHYROXINE SODIUM 25 MCG PO TABS: 25.0000 ug | ORAL_TABLET | Freq: Every day | ORAL | 0 refills | Status: DC

## 2016-06-10 ENCOUNTER — Telehealth: Payer: Self-pay

## 2016-06-10 NOTE — Telephone Encounter (Signed)
Patient notified of lab results by phone except the CRP was not back when I gave her the results of the other ones. Could you please review the CRP so I can notify the patient. Thanks

## 2016-09-03 ENCOUNTER — Other Ambulatory Visit: Payer: Self-pay | Admitting: Family Medicine

## 2016-09-03 DIAGNOSIS — E8881 Metabolic syndrome: Secondary | ICD-10-CM

## 2016-09-13 ENCOUNTER — Telehealth: Payer: Self-pay

## 2016-09-13 NOTE — Telephone Encounter (Signed)
[id number] for patient for her levothyroxine . Phone [phone] [phone]

## 2016-09-14 MED ORDER — LEVOTHYROXINE SODIUM 25 MCG PO TABS: 25.0000 ug | ORAL_TABLET | Freq: Every day | ORAL | 0 refills | Status: DC

## 2016-09-16 ENCOUNTER — Other Ambulatory Visit: Payer: Self-pay | Admitting: Family Medicine

## 2016-09-16 MED ORDER — LEVOTHYROXINE SODIUM 25 MCG PO TABS: 25.0000 ug | ORAL_TABLET | Freq: Every day | ORAL | 0 refills | Status: AC

## 2016-09-16 NOTE — Telephone Encounter (Signed)
[facility] MESSAGE TO SCHEDULE APPT MEDS REFILLED  FOR 30 DAYS.

## 2016-09-29 ENCOUNTER — Other Ambulatory Visit: Payer: Self-pay | Admitting: Family Medicine

## 2016-09-30 ENCOUNTER — Other Ambulatory Visit: Payer: Self-pay | Admitting: Family Medicine
